# Patient Record
Sex: Male | Born: 1980 | Race: White | Hispanic: No | Smoking: Former smoker
Health system: Southern US, Community
[De-identification: ages and names within clinical notes are randomized; demographics above are authoritative.]

## PROBLEM LIST (undated history)

## (undated) DIAGNOSIS — F32A Depression, unspecified: Secondary | ICD-10-CM

## (undated) DIAGNOSIS — B019 Varicella without complication: Secondary | ICD-10-CM

## (undated) DIAGNOSIS — F329 Major depressive disorder, single episode, unspecified: Secondary | ICD-10-CM

## (undated) DIAGNOSIS — J45909 Unspecified asthma, uncomplicated: Secondary | ICD-10-CM

## (undated) HISTORY — DX: Varicella without complication: B01.9

## (undated) HISTORY — DX: Unspecified asthma, uncomplicated: J45.909

## (undated) HISTORY — DX: Depression, unspecified: F32.A

## (undated) HISTORY — DX: Major depressive disorder, single episode, unspecified: F32.9

---

## 2009-05-02 ENCOUNTER — Emergency Department (HOSPITAL_COMMUNITY): Admission: EM | Admit: 2009-05-02 | Discharge: 2009-05-02 | Payer: Self-pay | Admitting: Emergency Medicine

## 2010-04-01 HISTORY — PX: REPAIR / RECONSTRUCTION COLLATERAL LIGAMENT HAND: SUR1146

## 2010-04-01 HISTORY — PX: VASECTOMY: SHX75

## 2012-04-30 ENCOUNTER — Encounter: Payer: Self-pay | Admitting: Family Medicine

## 2012-04-30 ENCOUNTER — Ambulatory Visit (INDEPENDENT_AMBULATORY_CARE_PROVIDER_SITE_OTHER): Payer: No Typology Code available for payment source | Admitting: Family Medicine

## 2012-04-30 VITALS — BP 120/82 | HR 93 | Temp 98.3°F | Ht 67.25 in | Wt 145.0 lb

## 2012-04-30 DIAGNOSIS — Z7689 Persons encountering health services in other specified circumstances: Secondary | ICD-10-CM

## 2012-04-30 DIAGNOSIS — Z7189 Other specified counseling: Secondary | ICD-10-CM

## 2012-04-30 DIAGNOSIS — D239 Other benign neoplasm of skin, unspecified: Secondary | ICD-10-CM

## 2012-04-30 DIAGNOSIS — D229 Melanocytic nevi, unspecified: Secondary | ICD-10-CM

## 2012-04-30 LAB — LIPID PANEL
HDL: 83.7 mg/dL (ref >39.00)
Total CHOL/HDL Ratio: 2
Triglycerides: 43 mg/dL (ref 0.0–149.0)
VLDL: 8.6 mg/dL (ref 0.0–40.0)

## 2012-04-30 NOTE — Progress Notes (Signed)
Chief Complaint  Patient presents with  . Establish Care    HPI:  HURBERT DURAN is here to establish care. Has not had doctor in many years as did not have health insurance until recently.   Has the following chronic problems and concerns today:  There is no problem list on file for this patient.  Mole on Abdomen: -has been there forever, but feels like has enlarged some -no melanoma in family - but mother had some sort of skin lesion treated -has not had a lot of sun exposure  Health Maintenance: -had flu vaccine a few weeks ago -had tdap in 2012 when cut thumb  ROS: See pertinent positives and negatives per HPI.  Past Medical History  Diagnosis Date  . Asthma     mild, intermittent    Family History  Problem Relation Age of Onset  . Cancer Mother     breast ca  . Cancer Maternal Grandfather 47    prostate    History   Social History  . Marital Status: Single    Spouse Name: N/A    Number of Children: N/A  . Years of Education: N/A   Social History Main Topics  . Smoking status: Former Smoker    Quit date: 04/01/2006  . Smokeless tobacco: None  . Alcohol Use: Yes     Comment: occ - 1 glass of wine  . Drug Use: No  . Sexually Active: Yes     Comment: with wife   Other Topics Concern  . None   Social History Narrative   Work or School: works full time as a Investment banker, operational - [employer]Home Situation: lives with wife and 3 kidsSpiritual Beliefs: none, atheistLifestyle: does jumping jacks and push ups and stretching daily, eats a healthy diet    No current outpatient prescriptions on file.  EXAM:  Filed Vitals:   04/30/12 0930  Pulse: 93  Temp: 98.3 F (36.8 C)    Body mass index is 22.54 kg/(m^2).  GENERAL: vitals reviewed and listed above, alert, oriented, appears well hydrated and in no acute distress  HEENT: atraumatic, conjunttiva clear, no obvious abnormalities on inspection of external nose and ears  NECK: no obvious masses on  inspection  LUNGS: clear to auscultation bilaterally, no wheezes, rales or rhonchi, good air movement  CV: HRRR, no peripheral edema  SKIN: numerous moles, raised brown lesion with a few projecting hairs on L upper abdomen  MS: moves all extremities without noticeable abnormality  PSYCH: pleasant and cooperative, no obvious depression or anxiety  ASSESSMENT AND PLAN:  Discussed the following assessment and plan:  1. Establishing care with new doctor, encounter for  Lipid Panel, Hemoglobin A1c  2. Nevus     -FASTING LABS TODAY -We reviewed the PMH, PSH, FH, SH, Meds and Allergies. -pt will return for skin check and removal of mole on abdomen -all health maintenance recs for age reviewed -safe alcohol limits and AHA exercise recs discussed   Patient Instructions  -We have ordered labs or studies at this visit. It can take up to 1-2 weeks for results and processing. We will contact you with instructions IF your results are abnormal. Normal results will be released to your [facility]. If you have not heard from [place] or can not find your results in [facility] in 2 weeks please contact our office.  -PLEASE SIGN UP FOR MYCHART TODAY   We recommend the following healthy lifestyle measures: - eat a healthy diet consisting of lots of vegetables,  fruits, beans, nuts, seeds, healthy meats such as white chicken and fish and whole grains.  - avoid fried foods, fast food, processed foods, sodas, red meet and other fattening foods.  - get a least 150 minutes of aerobic exercise per week.   Follow up in: next month for a 30 minute procedure appointment to remove mole      KIM, HANNAH R.

## 2012-04-30 NOTE — Patient Instructions (Addendum)
-  We have ordered labs or studies at this visit. It can take up to 1-2 weeks for results and processing. We will contact you with instructions IF your results are abnormal. Normal results will be released to your [facility]. If you have not heard from [place] or can not find your results in [facility] in 2 weeks please contact our office.  -PLEASE SIGN UP FOR MYCHART TODAY   We recommend the following healthy lifestyle measures: - eat a healthy diet consisting of lots of vegetables, fruits, beans, nuts, seeds, healthy meats such as white chicken and fish and whole grains.  - avoid fried foods, fast food, processed foods, sodas, red meet and other fattening foods.  - get a least 150 minutes of aerobic exercise per week.   Follow up in: next month for a 30 minute procedure appointment to remove mole

## 2012-10-06 ENCOUNTER — Ambulatory Visit (INDEPENDENT_AMBULATORY_CARE_PROVIDER_SITE_OTHER): Payer: No Typology Code available for payment source | Admitting: Family Medicine

## 2012-10-06 ENCOUNTER — Encounter: Payer: Self-pay | Admitting: Family Medicine

## 2012-10-06 VITALS — BP 92/60 | HR 116 | Temp 99.0°F | Wt 145.0 lb

## 2012-10-06 DIAGNOSIS — J209 Acute bronchitis, unspecified: Secondary | ICD-10-CM

## 2012-10-06 MED ORDER — PREDNISONE 20 MG PO TABS: 40.0000 mg | ORAL_TABLET | Freq: Every day | ORAL | Status: DC

## 2012-10-06 MED ORDER — ALBUTEROL SULFATE (5 MG/ML) 0.5% IN NEBU
2.5000 mg | INHALATION_SOLUTION | Freq: Once | RESPIRATORY_TRACT | Status: AC
  Administered 2012-10-06: 2.5 mg via RESPIRATORY_TRACT

## 2012-10-06 MED ORDER — AZITHROMYCIN 250 MG PO TABS: ORAL_TABLET | ORAL | Status: DC

## 2012-10-06 NOTE — Progress Notes (Signed)
Chief Complaint  Patient presents with  . Cough    congestion, bodyaches, chills     HPI:  Acute visit for cough: -started about 3 days ago -symptoms: nasal congestion, drainage, wheezing, chills, body aches, productive cough, mild SOB -has inhaler for his asthma, but has not used -has tried: nothing, dayquil, NVD, fever":  -sick contacts: daughter sick  ROS: See pertinent positives and negatives per HPI.  Past Medical History  Diagnosis Date  . Asthma     mild, intermittent  . Chicken pox   . Depression     Family History  Problem Relation Age of Onset  . Cancer Mother     breast ca  . Cancer Maternal Grandfather 49    prostate    History   Social History  . Marital Status: Single    Spouse Name: N/A    Number of Children: N/A  . Years of Education: N/A   Social History Main Topics  . Smoking status: Former Smoker    Quit date: 04/01/2006  . Smokeless tobacco: None  . Alcohol Use: Yes     Comment: occ - 1 glass of wine  . Drug Use: No  . Sexually Active: Yes     Comment: with wife   Other Topics Concern  . 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  GENERAL: vitals reviewed and listed above, alert, oriented, appears well hydrated and in no acute distress  HEENT: atraumatic, conjunttiva clear, no obvious abnormalities on  inspection of external nose and ears  NECK: no obvious masses on inspection  LUNGS: difuse exp wheezing with prolonged exp  CV: HRRR, no peripheral edema  MS: moves all extremities without noticeable abnormality  PSYCH: pleasant and cooperative, no obvious depression or anxiety  ASSESSMENT AND PLAN:  Discussed the following assessment and plan:  Acute bronchitis - Plan: predniSONE (DELTASONE) 20 MG tablet, azithromycin (ZITHROMAX) 250 MG tablet, albuterol (PROVENTIL) (5 MG/ML) 0.5% nebulizer solution 2.5 mg  -hx mild intermittent asthma with acute bronchitis, improved exam with alb nebs in office, no resp distress and O2 sats normal.  -Recommendations per orders an instructions, risks and use of medications and return precautions discussed. -Patient advised to return or notify a doctor immediately if symptoms worsen or persist or new concerns arise.  There are no Patient Instructions on file for this visit.   Kriste Basque R.

## 2012-10-09 ENCOUNTER — Emergency Department (INDEPENDENT_AMBULATORY_CARE_PROVIDER_SITE_OTHER)
Admission: EM | Admit: 2012-10-09 | Discharge: 2012-10-09 | Disposition: A | Discharge status: Home / Self Care | Payer: No Typology Code available for payment source | Source: Home / Self Care | Attending: Family Medicine | Admitting: Family Medicine

## 2012-10-09 ENCOUNTER — Emergency Department (INDEPENDENT_AMBULATORY_CARE_PROVIDER_SITE_OTHER): Payer: No Typology Code available for payment source

## 2012-10-09 ENCOUNTER — Telehealth: Payer: Self-pay | Admitting: Family Medicine

## 2012-10-09 ENCOUNTER — Encounter (HOSPITAL_COMMUNITY): Payer: Self-pay | Admitting: Emergency Medicine

## 2012-10-09 DIAGNOSIS — J45901 Unspecified asthma with (acute) exacerbation: Secondary | ICD-10-CM

## 2012-10-09 DIAGNOSIS — J4531 Mild persistent asthma with (acute) exacerbation: Secondary | ICD-10-CM

## 2012-10-09 MED ORDER — ALBUTEROL SULFATE HFA 108 (90 BASE) MCG/ACT IN AERS: 1.0000 | INHALATION_SPRAY | Freq: Four times a day (QID) | RESPIRATORY_TRACT | Status: DC | PRN

## 2012-10-09 MED ORDER — LEVOFLOXACIN 500 MG PO TABS: 500.0000 mg | ORAL_TABLET | Freq: Every day | ORAL | Status: DC

## 2012-10-09 MED ORDER — IPRATROPIUM BROMIDE 0.02 % IN SOLN
0.5000 mg | Freq: Once | RESPIRATORY_TRACT | Status: AC
  Administered 2012-10-09: 0.5 mg via RESPIRATORY_TRACT

## 2012-10-09 MED ORDER — METHYLPREDNISOLONE SODIUM SUCC 125 MG IJ SOLR
INTRAMUSCULAR | Status: AC
  Filled 2012-10-09: qty 2

## 2012-10-09 MED ORDER — ALBUTEROL SULFATE (5 MG/ML) 0.5% IN NEBU
5.0000 mg | INHALATION_SOLUTION | Freq: Once | RESPIRATORY_TRACT | Status: AC
  Administered 2012-10-09: 5 mg via RESPIRATORY_TRACT

## 2012-10-09 MED ORDER — ALBUTEROL SULFATE (5 MG/ML) 0.5% IN NEBU
INHALATION_SOLUTION | RESPIRATORY_TRACT | Status: AC
  Filled 2012-10-09: qty 1

## 2012-10-09 MED ORDER — METHYLPREDNISOLONE SODIUM SUCC 125 MG IJ SOLR
125.0000 mg | Freq: Once | INTRAMUSCULAR | Status: AC
  Administered 2012-10-09: 125 mg via INTRAMUSCULAR

## 2012-10-09 NOTE — ED Provider Notes (Signed)
History    CSN: [id number] Arrival date & time 10/09/12  1438  None    Chief Complaint  Patient presents with  . Bronchitis   (Consider location/radiation/quality/duration/timing/severity/associated sxs/prior Treatment) Patient is a 32 y.o. male presenting with shortness of breath. The history is provided by the patient.  Shortness of Breath Severity:  Moderate Onset quality:  Gradual Duration:  5 days Progression:  Worsening Chronicity:  New Context: not animal exposure, not occupational exposure and not pollens   Context comment:  Seen by lmd on mon , given z-pack, no change in sx. Associated symptoms: cough, ear pain, PND, sputum production and wheezing   Associated symptoms: no chest pain, no fever and no sore throat   Risk factors: no tobacco use    Past Medical History  Diagnosis Date  . Asthma     mild, intermittent  . Chicken pox   . Depression    Past Surgical History  Procedure Laterality Date  . Vasectomy  2012  . Repair / reconstruction collateral ligament hand  2012   Family History  Problem Relation Age of Onset  . Cancer Mother     breast ca  . Cancer Maternal Grandfather 78    prostate   History  Substance Use Topics  . Smoking status: Former Smoker    Quit date: 04/01/2006  . Smokeless tobacco: Not on file  . Alcohol Use: Yes     Comment: occ - 1 glass of wine    Review of Systems  Constitutional: Negative.  Negative for fever.  HENT: Positive for ear pain, congestion and rhinorrhea. Negative for sore throat.   Respiratory: Positive for cough, sputum production, shortness of breath and wheezing.   Cardiovascular: Positive for PND. Negative for chest pain, palpitations and leg swelling.  Gastrointestinal: Negative.     Allergies  Review of patient's allergies indicates no known allergies.  Home Medications   Current Outpatient Rx  Name  Route  Sig  Dispense  Refill  . albuterol (PROVENTIL HFA;VENTOLIN HFA) 108 (90 BASE) MCG/ACT  inhaler   Inhalation   Inhale 1-2 puffs into the lungs every 6 (six) hours as needed for wheezing.   1 Inhaler   1   . azithromycin (ZITHROMAX) 250 MG tablet      2 tablets on first day, then 1 tablet daily for 4 days   6 tablet   0   . levofloxacin (LEVAQUIN) 500 MG tablet   Oral   Take 1 tablet (500 mg total) by mouth daily.   7 tablet   0   . predniSONE (DELTASONE) 20 MG tablet   Oral   Take 2 tablets (40 mg total) by mouth daily.   10 tablet   0    BP 128/78  Pulse 102  Temp(Src) 98.7 F (37.1 C) (Oral)  Resp 20  SpO2 95% Physical Exam  Nursing note and vitals reviewed. Constitutional: He is oriented to person, place, and time. He appears well-developed and well-nourished.  HENT:  Right Ear: Tympanic membrane, external ear and ear canal normal.  Left Ear: Ear canal normal. Tympanic membrane is scarred and erythematous. Tympanic membrane mobility is abnormal.  Mouth/Throat: Oropharynx is clear and moist.  Neck: Normal range of motion. Neck supple.  Cardiovascular: Normal rate, regular rhythm, normal heart sounds and intact distal pulses.   Pulmonary/Chest: He has wheezes. He has rhonchi. He has rales.  Neurological: He is alert and oriented to person, place, and time.  Skin: Skin is warm and  dry.    ED Course  Procedures (including critical care time) Labs Reviewed - No data to display Dg Chest 2 View  10/09/2012   *RADIOLOGY REPORT*  Clinical Data: Short of breath, cough  CHEST - 2 VIEW  Comparison: None.  Findings: No active infiltrate or effusion is seen.  There is some peribronchial thickening which may indicate bronchitis. Mediastinal contours appear normal.  The heart is within normal limits in size.  No bony abnormality is seen  IMPRESSION: It no pneumonia.  Peribronchial thickening may indicate bronchitis.   Original Report Authenticated By: Dwyane Dee, M.D.   1. Asthmatic bronchitis, mild persistent, with acute exacerbation     MDM  X-rays reviewed  and report per radiologist.  Mod wheezing continues but pt feels sx improved.   Linna Hoff, MD 10/09/12 [phone]

## 2012-10-09 NOTE — Telephone Encounter (Signed)
Called and left a detailed message for pt to go to urgent care of be seen at Sat clinic.  Apologized that clinic was full today.

## 2012-10-09 NOTE — Telephone Encounter (Signed)
Patient Information:  Caller Name: Noland Fordyce  Phone: [phone]  Patient: Tayte, Mcwherter  Gender: Male  DOB: 07-Jan-1981  Age: 32 Years  PCP: Kriste Basque [facility])  Office Follow Up:  Does the office need to follow up with this patient?: Yes  Instructions For The Office: Please see if pt can be worked in. If not, please call wife and encourage them to go to UC.  RN Note:  No appt's available. Please call wife back and let her know if pt can be worked in or do they need to go to Urgent Care. Please advise.  Symptoms  Reason For Call & Symptoms: Wife calling regarding pt c/o of ear ache this am upon awakening. Has not treated that. Taking Zpack for Bronchitis dx on 10/06/12. Not better. Wife prefers pt be seen. Triage offered and declined. Denies respiratory distress.  Reviewed Health History In EMR: N/A  Reviewed Medications In EMR: N/A  Reviewed Allergies In EMR: N/A  Reviewed Surgeries / Procedures: N/A  Date of Onset of Symptoms: 10/06/2012  Guideline(s) Used:  No Protocol Available - Sick Adult  Disposition Per Guideline:   Callback by PCP or Subspecialist within 1 Hour  Reason For Disposition Reached:   Nursing judgment  Advice Given:  N/A  Patient Will Follow Care Advice:  YES

## 2012-10-09 NOTE — ED Notes (Signed)
Pt recently dx w/Bronchities by PCP on Tuesday... Given Azithromycin and prednisone w/no relief... No xrays were done at PCP's office... Tolerating well meds... sxs today include: persistent cough w/green mucous, woke up w/this am w/left ear pain... Reports some of his other sxs are getting better but still not feeling well... Hx of asthma... Denies: f/v/n/d... He is alert w/no signs of acute respiratory distress.

## 2012-10-09 NOTE — Telephone Encounter (Signed)
Got this message at end of clinic. Advise sat am clinic.

## 2012-10-10 ENCOUNTER — Encounter (HOSPITAL_COMMUNITY): Payer: Self-pay | Admitting: Emergency Medicine

## 2012-10-10 ENCOUNTER — Emergency Department (HOSPITAL_COMMUNITY): Payer: No Typology Code available for payment source

## 2012-10-10 ENCOUNTER — Emergency Department (HOSPITAL_COMMUNITY)
Admission: EM | Admit: 2012-10-10 | Discharge: 2012-10-10 | Disposition: A | Discharge status: Home / Self Care | Payer: No Typology Code available for payment source | Attending: Emergency Medicine | Admitting: Emergency Medicine

## 2012-10-10 DIAGNOSIS — J45909 Unspecified asthma, uncomplicated: Secondary | ICD-10-CM | POA: Insufficient documentation

## 2012-10-10 DIAGNOSIS — K2970 Gastritis, unspecified, without bleeding: Secondary | ICD-10-CM | POA: Insufficient documentation

## 2012-10-10 DIAGNOSIS — Z8619 Personal history of other infectious and parasitic diseases: Secondary | ICD-10-CM | POA: Insufficient documentation

## 2012-10-10 DIAGNOSIS — Z87891 Personal history of nicotine dependence: Secondary | ICD-10-CM | POA: Insufficient documentation

## 2012-10-10 DIAGNOSIS — Z79899 Other long term (current) drug therapy: Secondary | ICD-10-CM | POA: Insufficient documentation

## 2012-10-10 DIAGNOSIS — J690 Pneumonitis due to inhalation of food and vomit: Secondary | ICD-10-CM | POA: Insufficient documentation

## 2012-10-10 DIAGNOSIS — Z8659 Personal history of other mental and behavioral disorders: Secondary | ICD-10-CM | POA: Insufficient documentation

## 2012-10-10 LAB — URINE MICROSCOPIC-ADD ON

## 2012-10-10 LAB — COMPREHENSIVE METABOLIC PANEL
ALT: 16 U/L (ref 0–53)
AST: 24 U/L (ref 0–37)
Albumin: 3.9 g/dL (ref 3.5–5.2)
BUN: 11 mg/dL (ref 6–23)
CO2: 28 mEq/L (ref 19–32)
Calcium: 9.9 mg/dL (ref 8.4–10.5)
Chloride: 95 mEq/L — ABNORMAL LOW (ref 96–112)
Creatinine, Ser: 0.6 mg/dL (ref 0.50–1.35)
GFR calc Af Amer: >90 mL/min (ref >90)
Sodium: 135 mEq/L (ref 135–145)
Total Protein: 8.6 g/dL — ABNORMAL HIGH (ref 6.0–8.3)

## 2012-10-10 LAB — CBC WITH DIFFERENTIAL/PLATELET
Eosinophils Absolute: 0 10*3/uL (ref 0.0–0.7)
Lymphocytes Relative: 6 % — ABNORMAL LOW (ref 12–46)
MCH: 32.9 pg (ref 26.0–34.0)
MCV: 94.7 fL (ref 78.0–100.0)
Monocytes Relative: 8 % (ref 3–12)
Neutro Abs: 16.5 10*3/uL — ABNORMAL HIGH (ref 1.7–7.7)
Neutrophils Relative %: 86 % — ABNORMAL HIGH (ref 43–77)
Platelets: 238 10*3/uL (ref 150–400)
RBC: 4.31 MIL/uL (ref 4.22–5.81)
RDW: 12.4 % (ref 11.5–15.5)

## 2012-10-10 LAB — URINALYSIS, ROUTINE W REFLEX MICROSCOPIC
Bilirubin Urine: NEGATIVE
Glucose, UA: NEGATIVE mg/dL
Hgb urine dipstick: NEGATIVE
Ketones, ur: 15 mg/dL — AB
Nitrite: NEGATIVE
Specific Gravity, Urine: 1.026 (ref 1.005–1.030)

## 2012-10-10 MED ORDER — LORAZEPAM 2 MG/ML IJ SOLN
0.5000 mg | Freq: Once | INTRAMUSCULAR | Status: AC
  Administered 2012-10-10: 0.5 mg via INTRAVENOUS
  Filled 2012-10-10: qty 1

## 2012-10-10 MED ORDER — ONDANSETRON HCL 4 MG/2ML IJ SOLN
4.0000 mg | Freq: Once | INTRAMUSCULAR | Status: AC
  Administered 2012-10-10: 4 mg via INTRAVENOUS
  Filled 2012-10-10: qty 2

## 2012-10-10 MED ORDER — HYDROMORPHONE HCL PF 1 MG/ML IJ SOLN
1.0000 mg | Freq: Once | INTRAMUSCULAR | Status: AC
  Administered 2012-10-10: 1 mg via INTRAVENOUS
  Filled 2012-10-10: qty 1

## 2012-10-10 MED ORDER — PANTOPRAZOLE SODIUM 20 MG PO TBEC: 40.0000 mg | DELAYED_RELEASE_TABLET | Freq: Every day | ORAL | Status: DC

## 2012-10-10 MED ORDER — IOHEXOL 300 MG/ML  SOLN
100.0000 mL | Freq: Once | INTRAMUSCULAR | Status: AC | PRN
  Administered 2012-10-10: 100 mL via INTRAVENOUS

## 2012-10-10 MED ORDER — IOHEXOL 300 MG/ML  SOLN
50.0000 mL | Freq: Once | INTRAMUSCULAR | Status: AC | PRN
  Administered 2012-10-10: 50 mL via ORAL

## 2012-10-10 MED ORDER — HYDROCODONE-ACETAMINOPHEN 5-325 MG PO TABS: 1.0000 | ORAL_TABLET | Freq: Four times a day (QID) | ORAL | Status: DC | PRN

## 2012-10-10 MED ORDER — PANTOPRAZOLE SODIUM 40 MG IV SOLR
40.0000 mg | Freq: Once | INTRAVENOUS | Status: AC
  Administered 2012-10-10: 40 mg via INTRAVENOUS
  Filled 2012-10-10: qty 40

## 2012-10-10 NOTE — ED Notes (Signed)
Wife at bedside. Pt c/o mid abdominal pain onset today. States he left work d/t increase pain intensity. He is currently experiencing intermittent spasms and guarding abdomen.

## 2012-10-10 NOTE — ED Notes (Signed)
Pt from home c/o severe onset abd pain 4 hrs prior to arrival. Pt denies N/V/D, fever, SOB, CP. Pt states that he is currently being tx for acute asthmatic bronchitis with azithromycin and prednisone. Pt last BM was yesterday. Pt is A&O and in NAD

## 2012-10-10 NOTE — ED Provider Notes (Signed)
History    CSN: [id number] Arrival date & time 10/10/12  1718  First MD Initiated Contact with Patient 10/10/12 1724     Chief Complaint  Patient presents with  . Abdominal Pain   (Consider location/radiation/quality/duration/timing/severity/associated sxs/prior Treatment) Patient is a 32 y.o. male presenting with abdominal pain. The history is provided by the patient (pt complains of abd pain.  he just finished zpack and prednisone for bronchitis). No language interpreter was used.  Abdominal Pain This is a new problem. The current episode started 12 to 24 hours ago. The problem occurs constantly. The problem has not changed since onset.Associated symptoms include abdominal pain. Pertinent negatives include no chest pain and no headaches. Nothing aggravates the symptoms. Nothing relieves the symptoms.   Past Medical History  Diagnosis Date  . Asthma     mild, intermittent  . Chicken pox   . Depression    Past Surgical History  Procedure Laterality Date  . Vasectomy  2012  . Repair / reconstruction collateral ligament hand  2012   Family History  Problem Relation Age of Onset  . Cancer Mother     breast ca  . Cancer Maternal Grandfather 22    prostate   History  Substance Use Topics  . Smoking status: Former Smoker    Quit date: 04/01/2006  . Smokeless tobacco: Not on file  . Alcohol Use: Yes     Comment: occ - 1 glass of wine    Review of Systems  Constitutional: Negative for appetite change and fatigue.  HENT: Negative for congestion, sinus pressure and ear discharge.   Eyes: Negative for discharge.  Respiratory: Negative for cough.   Cardiovascular: Negative for chest pain.  Gastrointestinal: Positive for abdominal pain. Negative for diarrhea.  Genitourinary: Negative for frequency and hematuria.  Musculoskeletal: Negative for back pain.  Skin: Negative for rash.  Neurological: Negative for seizures and headaches.  Psychiatric/Behavioral: Negative for  hallucinations.    Allergies  Review of patient's allergies indicates no known allergies.  Home Medications   Current Outpatient Rx  Name  Route  Sig  Dispense  Refill  . albuterol (PROVENTIL HFA;VENTOLIN HFA) 108 (90 BASE) MCG/ACT inhaler   Inhalation   Inhale 1-2 puffs into the lungs every 6 (six) hours as needed for wheezing.   1 Inhaler   1   . amphetamine-dextroamphetamine (ADDERALL) 10 MG tablet   Oral   Take 10 mg by mouth once.         Marland Kitchen azithromycin (ZITHROMAX) 250 MG tablet      2 tablets on first day, then 1 tablet daily for 4 days   6 tablet   0   . predniSONE (DELTASONE) 20 MG tablet   Oral   Take 2 tablets (40 mg total) by mouth daily.   10 tablet   0   . HYDROcodone-acetaminophen (NORCO/VICODIN) 5-325 MG per tablet   Oral   Take 1 tablet by mouth every 6 (six) hours as needed for pain.   20 tablet   0   . levofloxacin (LEVAQUIN) 500 MG tablet   Oral   Take 1 tablet (500 mg total) by mouth daily.   7 tablet   0   . pantoprazole (PROTONIX) 20 MG tablet   Oral   Take 2 tablets (40 mg total) by mouth daily.   20 tablet   0    BP 133/95  Pulse 67  Temp(Src) 98 F (36.7 C) (Oral)  Resp 18  SpO2 97% Physical  Exam  Constitutional: He is oriented to person, place, and time. He appears well-developed.  HENT:  Head: Normocephalic.  Eyes: Conjunctivae and EOM are normal. No scleral icterus.  Neck: Neck supple. No thyromegaly present.  Cardiovascular: Normal rate and regular rhythm.  Exam reveals no gallop and no friction rub.   No murmur heard. Pulmonary/Chest: No stridor. He has no wheezes. He has no rales. He exhibits no tenderness.  Abdominal: He exhibits no distension. There is tenderness. There is no rebound.  Tender epigastric  Musculoskeletal: Normal range of motion. He exhibits no edema.  Lymphadenopathy:    He has no cervical adenopathy.  Neurological: He is oriented to person, place, and time. Coordination normal.  Skin: No rash  noted. No erythema.  Psychiatric: He has a normal mood and affect. His behavior is normal.    ED Course  Procedures (including critical care time) Labs Reviewed  CBC WITH DIFFERENTIAL - Abnormal; Notable for the following:    WBC 19.2 (*)    Neutrophils Relative % 86 (*)    Lymphocytes Relative 6 (*)    Neutro Abs 16.5 (*)    Monocytes Absolute 1.5 (*)    All other components within normal limits  COMPREHENSIVE METABOLIC PANEL - Abnormal; Notable for the following:    Chloride 95 (*)    Glucose, Bld 118 (*)    Total Protein 8.6 (*)    All other components within normal limits  URINALYSIS, ROUTINE W REFLEX MICROSCOPIC - Abnormal; Notable for the following:    Ketones, ur 15 (*)    Protein, ur 30 (*)    All other components within normal limits  LIPASE, BLOOD  URINE MICROSCOPIC-ADD ON   Dg Chest 2 View  10/09/2012   *RADIOLOGY REPORT*  Clinical Data: Short of breath, cough  CHEST - 2 VIEW  Comparison: None.  Findings: No active infiltrate or effusion is seen.  There is some peribronchial thickening which may indicate bronchitis. Mediastinal contours appear normal.  The heart is within normal limits in size.  No bony abnormality is seen  IMPRESSION: It no pneumonia.  Peribronchial thickening may indicate bronchitis.   Original Report Authenticated By: Dwyane Dee, M.D.   Ct Abdomen Pelvis W Contrast  10/10/2012   *RADIOLOGY REPORT*  Clinical Data: Abdominal pain.  Evaluate for possible appendicitis. No fever.  Elevated white count.  CT ABDOMEN AND PELVIS WITH CONTRAST  Technique:  Multidetector CT imaging of the abdomen and pelvis was performed following the standard protocol during bolus administration of intravenous contrast.  Contrast: OMNIPAQUE IOHEXOL 300 MG/ML  SOLN, 50mL OMNIPAQUE IOHEXOL 300 MG/ML  SOLN  Comparison: None.  Findings: No CT findings of appendicitis.  No extraluminal bowel inflammatory process, free fluid or free air. Small bowel loops appear partially dilated  without point of obstruction identified.  Etiology/significance indeterminate. Additionally, there is mild thickening of the gastric antrum possibly related to peristalsis.  Primary gastric abnormality would be difficult to exclude given this appearance.  No calcified gallstones or CT evidence of gallbladder inflammation. If this is of concern ultrasound may then be considered.  No worrisome hepatic, splenic, pancreatic, renal or adrenal lesion.  No abdominal aortic aneurysm.  No bony destructive lesion.  No hernia noted.  Patchy changes lung bases with a 1.4 cm opacification right middle lobe.  Findings may represent result of infection.  Recommend treating any clinically suspected infectious process with follow-up CT after treatment to exclude a primary lung lesion.  IMPRESSION: No CT findings of appendicitis.  No extraluminal bowel inflammatory process, free fluid or free air. Small bowel loops appear partially dilated without point of obstruction identified.  Etiology/significance indeterminate. Additionally, there is mild thickening of the gastric antrum possibly related to peristalsis.  Primary gastric abnormality would be difficult to exclude given this appearance.  Patchy changes lung bases with a 1.4 cm opacification right middle lobe.  Findings may represent result of infection.  Recommend treating any clinically suspected infectious process with follow-up CT after treatment to exclude a primary lung lesion.   Original Report Authenticated By: Lacy Duverney, M.D.   Dg Abd Acute W/chest  10/10/2012   *RADIOLOGY REPORT*  Clinical Data: Mid abdominal pain.  Bronchitis.  ACUTE ABDOMEN SERIES (ABDOMEN 2 VIEW & CHEST 1 VIEW)  Comparison:  Chest radiograph on 10/09/2012  Findings:  There is no evidence of dilated bowel loops or free intraperitoneal air.  No radiopaque calculi or other significant radiographic abnormality is seen. Heart size and mediastinal contours are within normal limits.  Both lungs are  clear.  IMPRESSION: Negative abdominal radiographs.  No acute cardiopulmonary disease.   Original Report Authenticated By: Myles Rosenthal, M.D.   1. Gastritis   2. Aspiration pneumonia     MDM    Benny Lennert, MD 10/10/12 2124

## 2012-10-10 NOTE — ED Notes (Signed)
Pt aware of the need for a urine sample. Urinal at bedside. 

## 2012-10-15 ENCOUNTER — Ambulatory Visit (INDEPENDENT_AMBULATORY_CARE_PROVIDER_SITE_OTHER): Payer: No Typology Code available for payment source | Admitting: Family Medicine

## 2012-10-15 ENCOUNTER — Encounter: Payer: Self-pay | Admitting: Family Medicine

## 2012-10-15 VITALS — BP 110/80 | HR 71 | Temp 98.5°F | Wt 142.0 lb

## 2012-10-15 DIAGNOSIS — J4520 Mild intermittent asthma, uncomplicated: Secondary | ICD-10-CM

## 2012-10-15 DIAGNOSIS — J45909 Unspecified asthma, uncomplicated: Secondary | ICD-10-CM

## 2012-10-15 MED ORDER — PREDNISONE 20 MG PO TABS: ORAL_TABLET | ORAL | Status: DC

## 2012-10-15 NOTE — Patient Instructions (Addendum)
Prednisone one tablet daily until your symptoms abate.............. Half a tablet for 7 days then a half a tab Monday Wednesday Friday for a full week taper  Return when necessary

## 2012-10-15 NOTE — Progress Notes (Signed)
  Subjective:    Patient ID: Frank Schroeder, male    DOB: 1980-05-13, 32 y.o.   MRN: [id number]  HPI  Frank Schroeder is a 32 year old married male nonsmoker,,,,,,,,, quit 3 years ago,,,,,,, who has a history of underlying allergic rhinitis who comes in today for evaluation of a cough  He was seen here about 10 days ago and was diagnosed to have bronchitis. He was given a Z-Pak and 5 days the prednisone. His symptoms got worse he got more short of breath. He therefore went to the emergency room. Chest x-ray and CT scan of the abdomen all were normal. He was given Levaquin and told he had bronchitis. He took the Levaquin and hasn't helped he still coughing and wheezing.  Historically has an episode like this once every year or 2. Sometimes triggered by a viral infection. Again he does have a history of allergic rhinitis.  Environmental review of systems otherwise negative  Review of Systems    general and pulmonary review of systems otherwise negative Objective:   Physical Exam Well-developed well-nourished male no acute distress examination the HEENT were negative neck was supple no adenopathy thyroid normal chest shows symmetrical normal breath sounds late expiratory wheezing on forced expiration.       Assessment & Plan:  Reactive airways disease with wheezing,,,,,,,,,,,,,prednisone burst and taper

## 2013-02-04 ENCOUNTER — Other Ambulatory Visit: Payer: Self-pay

## 2013-09-20 ENCOUNTER — Encounter: Payer: Self-pay | Admitting: *Deleted

## 2013-09-20 ENCOUNTER — Other Ambulatory Visit: Payer: Self-pay | Admitting: Family Medicine

## 2013-09-20 ENCOUNTER — Ambulatory Visit (INDEPENDENT_AMBULATORY_CARE_PROVIDER_SITE_OTHER): Payer: Self-pay | Admitting: Family Medicine

## 2013-09-20 ENCOUNTER — Encounter: Payer: Self-pay | Admitting: Family Medicine

## 2013-09-20 VITALS — BP 110/76 | HR 75 | Temp 98.8°F | Ht 67.25 in | Wt 143.0 lb

## 2013-09-20 DIAGNOSIS — D239 Other benign neoplasm of skin, unspecified: Secondary | ICD-10-CM

## 2013-09-20 DIAGNOSIS — D229 Melanocytic nevi, unspecified: Secondary | ICD-10-CM

## 2013-09-20 NOTE — Progress Notes (Signed)
No chief complaint on file.   HPI:  Acute visit for:  1)Skin lesion: -wants to remove mole on abdomen -no recent changes -sometimes irritated  -denies: swelling, drainage, bleeding -denies FH melanoma  ROS: See pertinent positives and negatives per HPI.  Past Medical History  Diagnosis Date  . Asthma     mild, intermittent  . Chicken pox   . Depression     Past Surgical History  Procedure Laterality Date  . Vasectomy  2012  . Repair / reconstruction collateral ligament hand  2012    Family History  Problem Relation Age of Onset  . Cancer Mother     breast ca  . Cancer Maternal Grandfather 50    prostate    History   Social History  . Marital Status: Single    Spouse Name: N/A    Number of Children: N/A  . Years of Education: N/A   Social History Main Topics  . Smoking status: Former Smoker    Quit date: 04/01/2006  . Smokeless tobacco: None  . Alcohol Use: Yes     Comment: occ - 1 glass of wine  . Drug Use: No  . Sexual Activity: Yes     Comment: with wife   Other Topics Concern  . 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  GENERAL: vitals reviewed and listed above, alert, oriented, appears well hydrated and in no acute distress  HEENT: atraumatic, conjunttiva clear, no obvious abnormalities on inspection of external nose and ears  NECK: no obvious masses on  inspection  SKIN: raised, 35mm x 56mm dark brown verrucous papule with irr borders on L mid abdomen  MS: moves all extremities without noticeable abnormality  PSYCH: pleasant and cooperative, no obvious depression or anxiety  ASSESSMENT AND PLAN:  Discussed the following assessment and plan:  Atypical nevus  Shave Biopsy: Informed consent obtained after explanation of risks/benefits and other options. Time out. Area cleaned with betadine. Local anesthesia with lidocaine 1% with epi. Shave removal of lesion. Hemostasis with drysol. Tolerated well. Specimen sent for path. Wound care instructions provided. Will contact pt with path results.  -Patient advised to return or notify a doctor immediately if symptoms worsen or persist or new concerns arise.  Patient Instructions  -please see dermatologist once yearly for skin exam and use good sunscreen when in the sun  -We have ordered labs or studies at this visit. It can take up to 1-2 weeks for results and processing. We will contact you with instructions IF your results are abnormal. Normal results will be released to your [facility]. If you have not heard from [place] or can not find your results in [facility] in 2 weeks please contact our office.  Colin Benton R.

## 2013-09-20 NOTE — Progress Notes (Signed)
Pre visit review using our clinic review tool, if applicable. No additional management support is needed unless otherwise documented below in the visit note. 

## 2013-09-20 NOTE — Addendum Note (Signed)
Addended by: Agnes Lawrence on: 09/20/2013 10:19 AM   Modules accepted: Orders

## 2013-09-20 NOTE — Patient Instructions (Signed)
-  please see dermatologist once yearly for skin exam and use good sunscreen when in the sun  -We have ordered labs or studies at this visit. It can take up to 1-2 weeks for results and processing. We will contact you with instructions IF your results are abnormal. Normal results will be released to your [facility]. If you have not heard from [place] or can not find your results in [facility] in 2 weeks please contact our office.

## 2014-05-26 ENCOUNTER — Ambulatory Visit (INDEPENDENT_AMBULATORY_CARE_PROVIDER_SITE_OTHER): Payer: BLUE CROSS/BLUE SHIELD | Admitting: Family Medicine

## 2014-05-26 ENCOUNTER — Encounter: Payer: Self-pay | Admitting: Family Medicine

## 2014-05-26 VITALS — BP 120/80 | HR 83 | Temp 98.5°F | Ht 67.25 in | Wt 151.9 lb

## 2014-05-26 DIAGNOSIS — J069 Acute upper respiratory infection, unspecified: Secondary | ICD-10-CM

## 2014-05-26 NOTE — Patient Instructions (Signed)
INSTRUCTIONS FOR UPPER RESPIRATORY INFECTION:  -plenty of rest and fluids  -nasal saline wash 2-3 times daily (use prepackaged nasal saline or bottled/distilled water if making your own)   -can use afrin nasal spray for drainage and nasal congestion - but do NOT use longer then 3-4 days  -can use tylenol or ibuprofen as directed for aches and sorethroat  -in the winter time, using a humidifier at night is helpful (please follow cleaning instructions)  -if you are taking a cough medication - use only as directed, may also try a teaspoon of honey to coat the throat and throat lozenges  -for sore throat, salt water gargles can help  -follow up if you have fevers, facial pain, tooth pain, difficulty breathing or are worsening or not getting better as expected

## 2014-05-26 NOTE — Progress Notes (Signed)
HPI:   URI: -started: today -symptoms:PND, sore throat, tickle in throat -denies:fever, SOB, NVD, tooth pain, sinus pain -has tried: no -sick contacts/travel/risks: denies flu exposure, tick exposure or or Ebola risks, daughter with cold, his kids were around kids who had strep   ROS: See pertinent positives and negatives per HPI.  Past Medical History  Diagnosis Date  . Asthma     mild, intermittent  . Chicken pox   . Depression     Past Surgical History  Procedure Laterality Date  . Vasectomy  2012  . Repair / reconstruction collateral ligament hand  2012    Family History  Problem Relation Age of Onset  . Cancer Mother     breast ca  . Cancer Maternal Grandfather 54    prostate    History   Social History  . Marital Status: Single    Spouse Name: N/A  . Number of Children: N/A  . Years of Education: N/A   Social History Main Topics  . Smoking status: Former Smoker    Quit date: 04/01/2006  . Smokeless tobacco: Not on file  . Alcohol Use: Yes     Comment: occ - 1 glass of wine  . Drug Use: No  . Sexual Activity: Yes     Comment: with wife   Other Topics Concern  . 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  albuterol (PROVENTIL HFA;VENTOLIN HFA) 108 (90 BASE) MCG/ACT inhaler, Inhale 1-2 puffs into the lungs every 6 (six) hours as needed for wheezing., Disp: 1 Inhaler, Rfl: 1  EXAM:  Filed Vitals:   05/26/14 1352  BP: 120/80  Pulse: 83  Temp: 98.5 F (36.9 C)    Body mass index is 23.62 kg/(m^2).  GENERAL: vitals reviewed and listed above, alert, oriented, appears well hydrated and in no acute distress  HEENT: atraumatic, conjunttiva clear, no obvious  abnormalities on inspection of external nose and ears, normal appearance of ear canals and TMs, clear nasal congestion, mild post oropharyngeal erythema with PND, no tonsillar edema or exudate, no sinus TTP  NECK: no obvious masses on inspection  LUNGS: clear to auscultation bilaterally, no wheezes, rales or rhonchi, good air movement  CV: HRRR, no peripheral edema  MS: moves all extremities without noticeable abnormality  PSYCH: pleasant and cooperative, no obvious depression or anxiety  ASSESSMENT AND PLAN:  Discussed the following assessment and plan:  Acute upper respiratory infection  -given HPI and exam findings today, a serious infection or illness is unlikely. We discussed potential etiologies, with VURI being most likely, and advised supportive care and monitoring. We discussed treatment side effects, likely course, antibiotic misuse, transmission, and signs of developing a serious illness. -of course, we advised to return or notify a doctor immediately if symptoms worsen or persist or new concerns arise.    Patient Instructions  INSTRUCTIONS FOR UPPER RESPIRATORY INFECTION:  -plenty of rest and fluids  -nasal saline wash 2-3 times daily (use prepackaged nasal saline or bottled/distilled water if making your own)   -can use afrin nasal spray for drainage and nasal  congestion - but do NOT use longer then 3-4 days  -can use tylenol or ibuprofen as directed for aches and sorethroat  -in the winter time, using a humidifier at night is helpful (please follow cleaning instructions)  -if you are taking a cough medication - use only as directed, may also try a teaspoon of honey to coat the throat and throat lozenges  -for sore throat, salt water gargles can help  -follow up if you have fevers, facial pain, tooth pain, difficulty breathing or are worsening or not getting better as expected      Frank Schroeder, [name] R.

## 2014-05-26 NOTE — Progress Notes (Signed)
Pre visit review using our clinic review tool, if applicable. No additional management support is needed unless otherwise documented below in the visit note. 

## 2014-06-29 IMAGING — CT CT ABD-PELV W/ CM
1 of 2 series · 15 of 32 positions shown, 19 images · IV contrast (OMNIPAQUE 300)
Comparison: None.

CLINICAL DATA: Abdominal pain.  Evaluate for possible appendicitis.
No fever.  Elevated white count.

CT ABDOMEN AND PELVIS WITH CONTRAST
TECHNIQUE: Multidetector CT imaging of the abdomen and pelvis was
performed following the standard protocol during bolus
administration of intravenous contrast.
Contrast: 100mL OMNIPAQUE IOHEXOL 300 MG/ML  SOLN, 50mL OMNIPAQUE
IOHEXOL 300 MG/ML  SOLN

[Series 2: abd/pel with · axial · 0.66mm/px · z∈[-499,-109]mm · 15 of 86 slices shown, 19 images]
[im 4/86  soft-tissue]
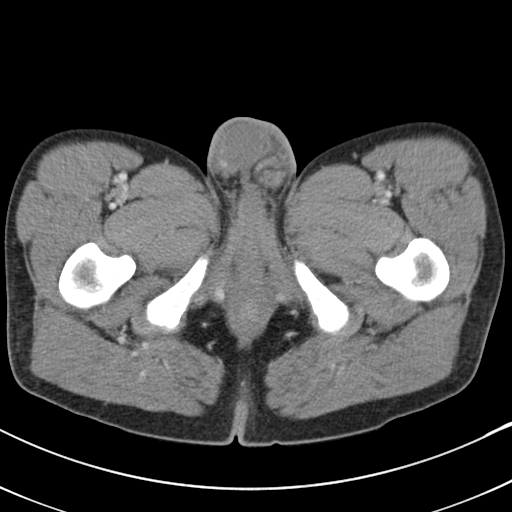
[im 4/86  bone]
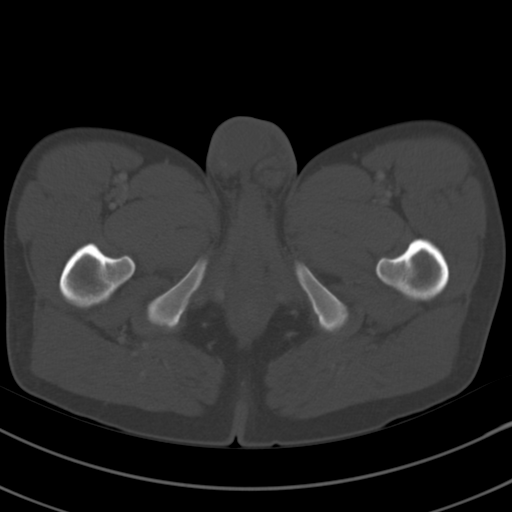
[im 10/86  soft-tissue]
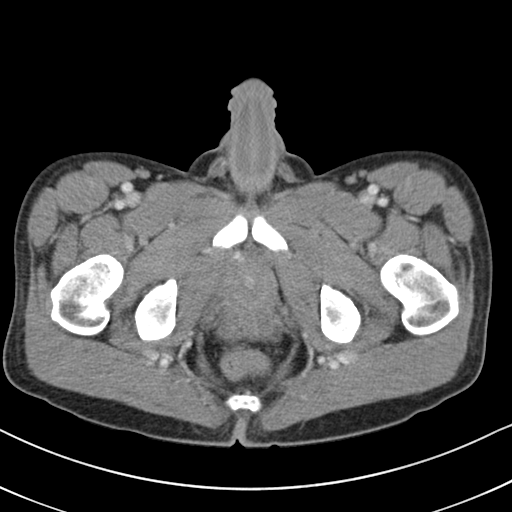
[im 17/86  soft-tissue]
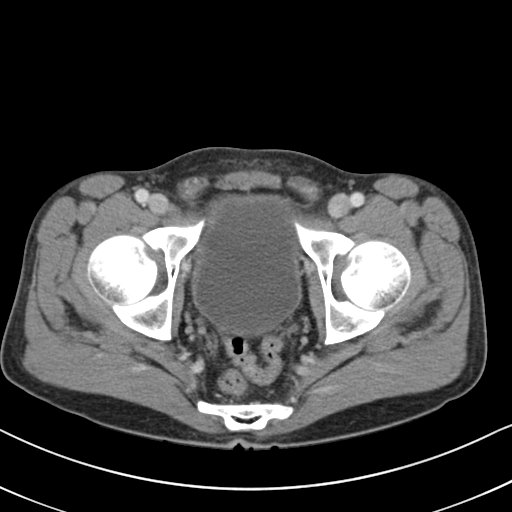
[im 23/86  soft-tissue]
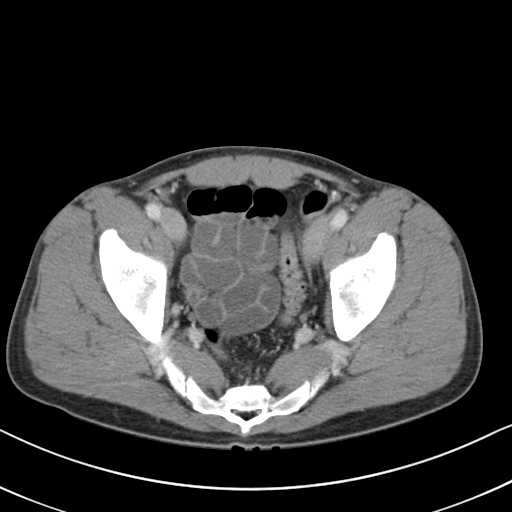
[im 30/86  soft-tissue]
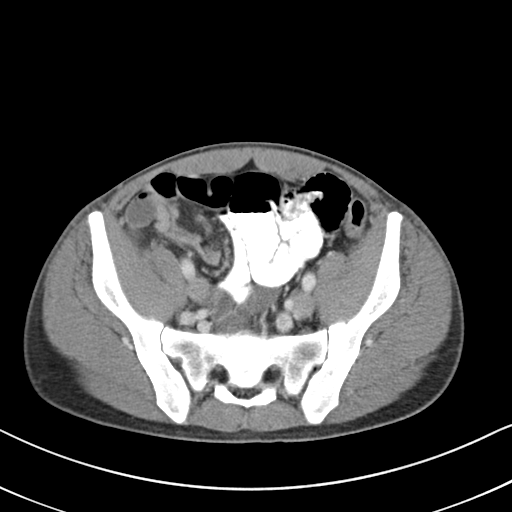
[im 36/86  soft-tissue]
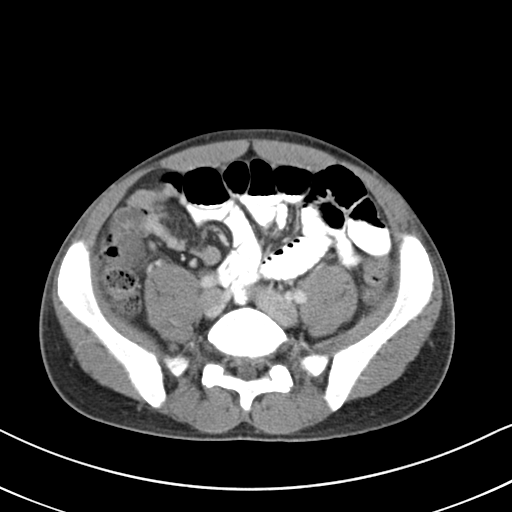
[im 43/86  soft-tissue]
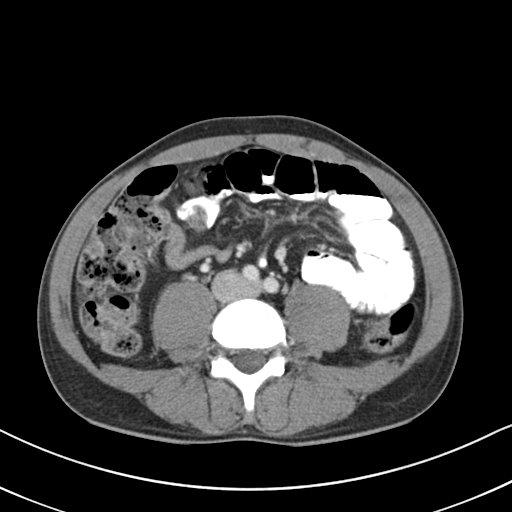
[im 50/86  soft-tissue]
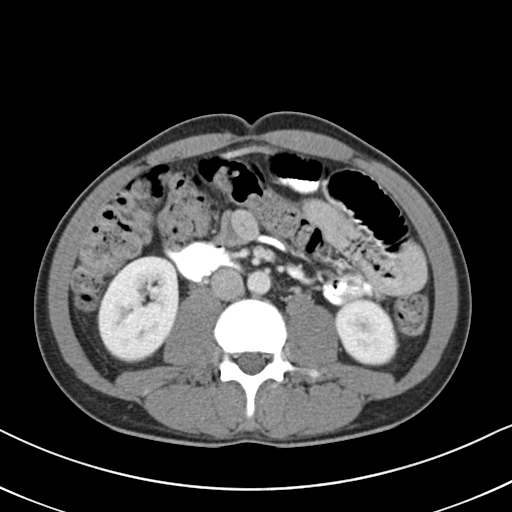
[im 56/86  soft-tissue]
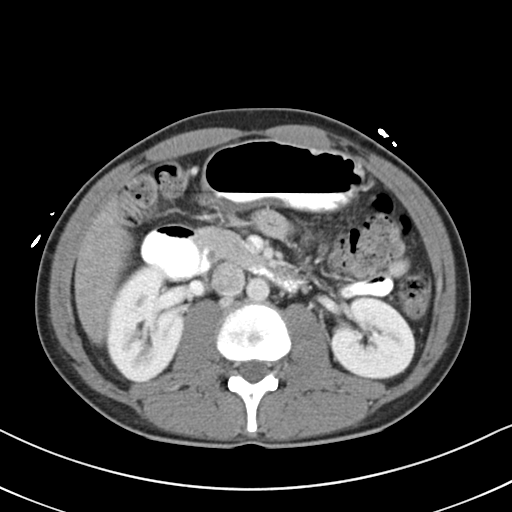
[im 56/86  bone]
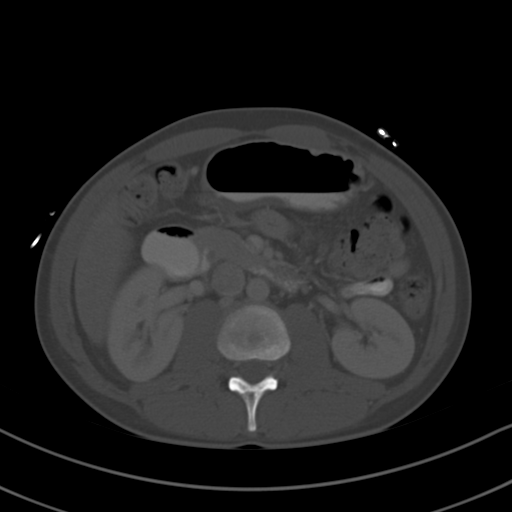
[im 63/86  soft-tissue]
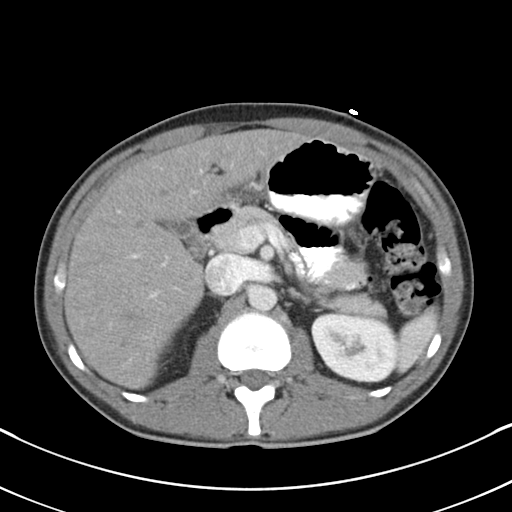
[im 69/86  soft-tissue]
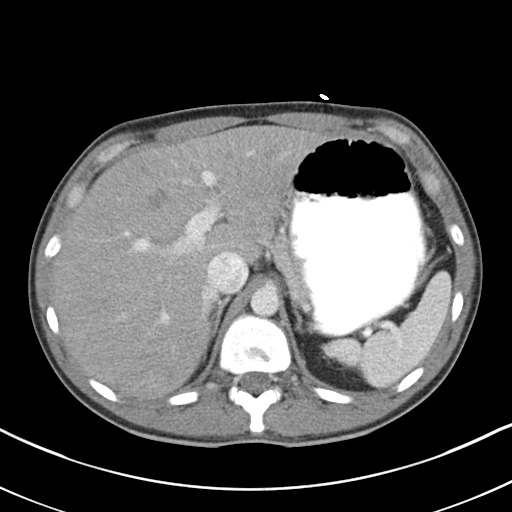
[im 72/86  lung]
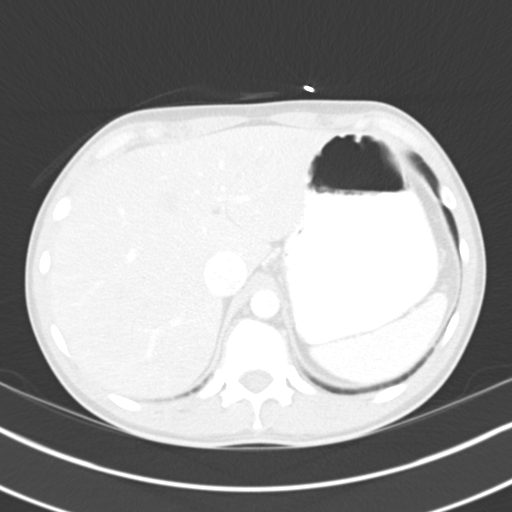
[im 76/86  soft-tissue]
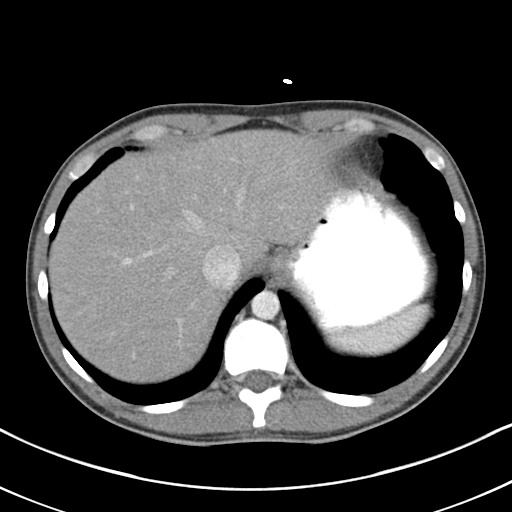
[im 76/86  lung]
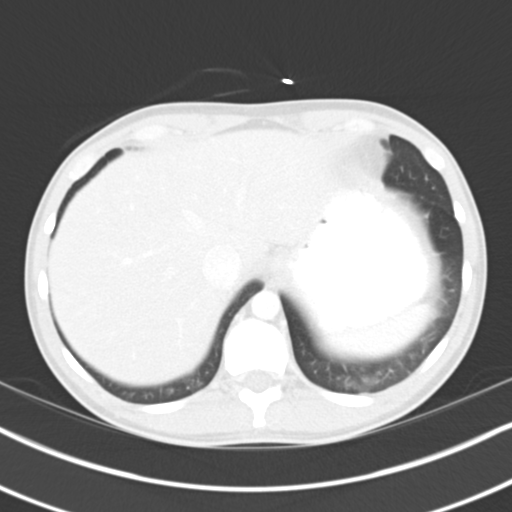
[im 79/86  lung]
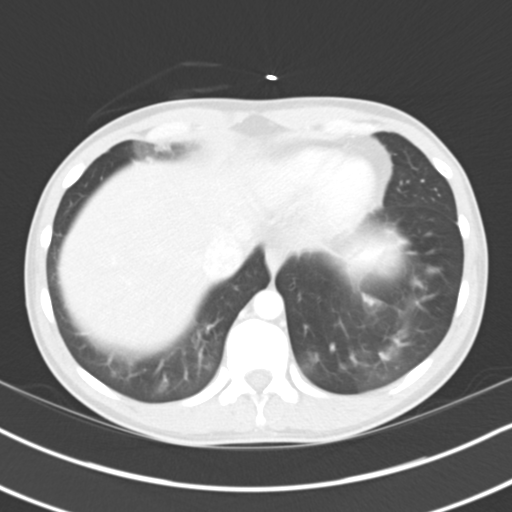
[im 82/86  soft-tissue]
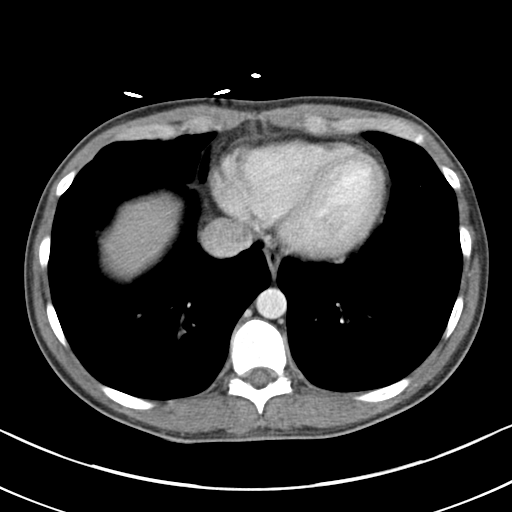
[im 82/86  lung]
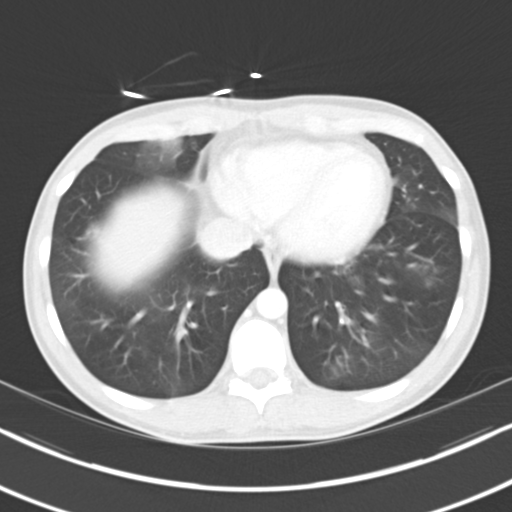

[15 of 32 positions shown; findings below may reference images not displayed]

FINDINGS: No CT findings of appendicitis.

No extraluminal bowel inflammatory process, free fluid or free air.
Small bowel loops appear partially dilated without point of
obstruction identified.  Etiology/significance indeterminate.
Additionally, there is mild thickening of the gastric antrum
possibly related to peristalsis.  Primary gastric abnormality would
be difficult to exclude given this appearance.

No calcified gallstones or CT evidence of gallbladder inflammation.
If this is of concern ultrasound may then be considered.

No worrisome hepatic, splenic, pancreatic, renal or adrenal lesion.

No abdominal aortic aneurysm.

No bony destructive lesion.

No hernia noted.

Patchy changes lung bases with a 1.4 cm opacification right middle
lobe.  Findings may represent result of infection.  Recommend
treating any clinically suspected infectious process with follow-up
CT after treatment to exclude a primary lung lesion.
IMPRESSION: No CT findings of appendicitis.

No extraluminal bowel inflammatory process, free fluid or free air.
Small bowel loops appear partially dilated without point of
obstruction identified.  Etiology/significance indeterminate.
Additionally, there is mild thickening of the gastric antrum
possibly related to peristalsis.  Primary gastric abnormality would
be difficult to exclude given this appearance.

Patchy changes lung bases with a 1.4 cm opacification right middle
lobe.  Findings may represent result of infection.  Recommend
treating any clinically suspected infectious process with follow-up
CT after treatment to exclude a primary lung lesion.

## 2014-06-29 IMAGING — CR DG ABDOMEN ACUTE W/ 1V CHEST
4 series · 4 of 4 positions shown · non-contrast
Comparison: Chest radiograph on 10/09/2012

CLINICAL DATA: Mid abdominal pain.  Bronchitis.

ACUTE ABDOMEN SERIES (ABDOMEN 2 VIEW & CHEST 1 VIEW)

[w chest pa]
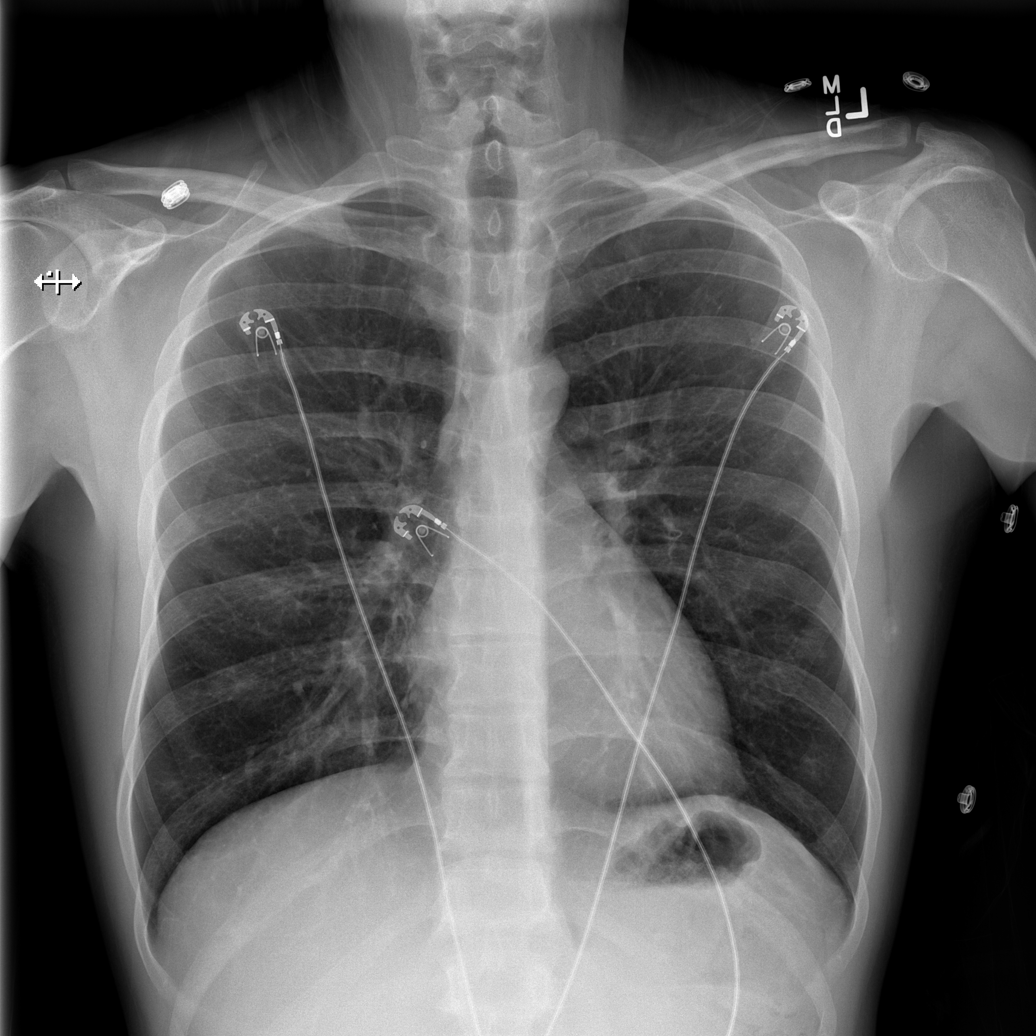

[w abdomen upright]
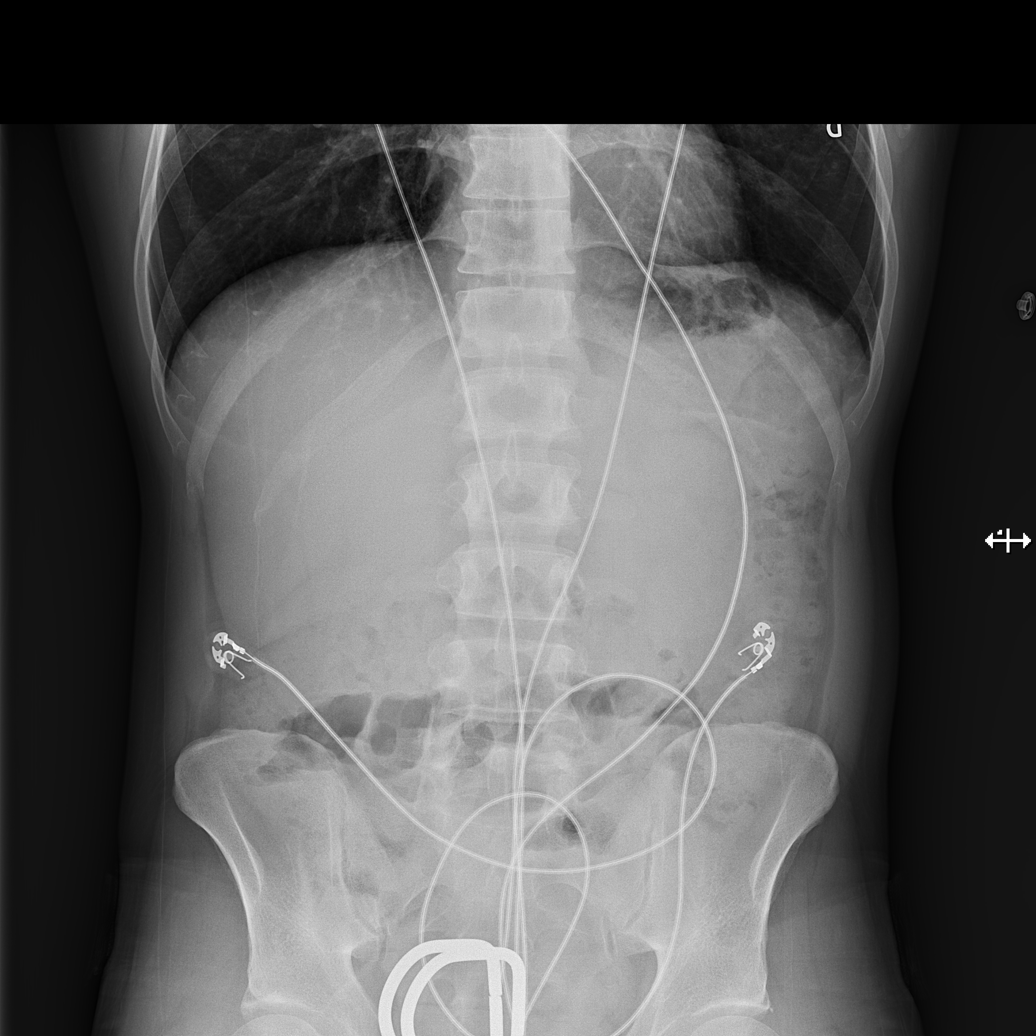

[t abdomen supine (1 of 2)]
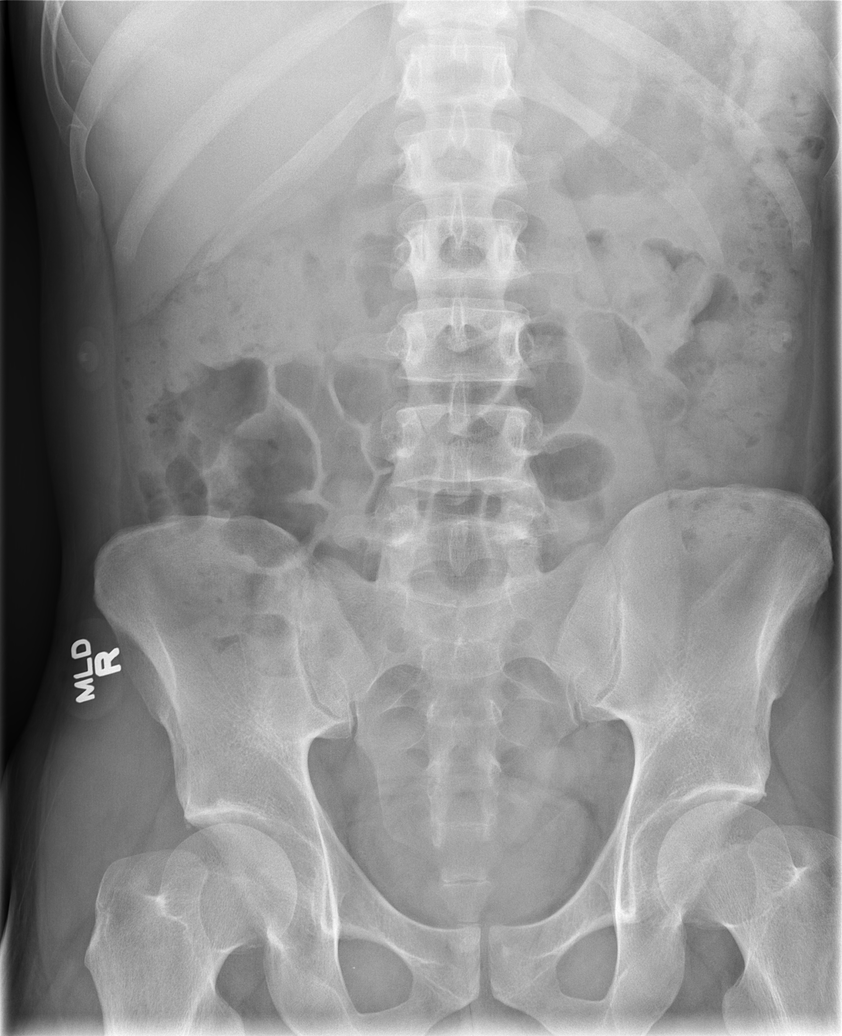

[t abdomen supine (2 of 2)]
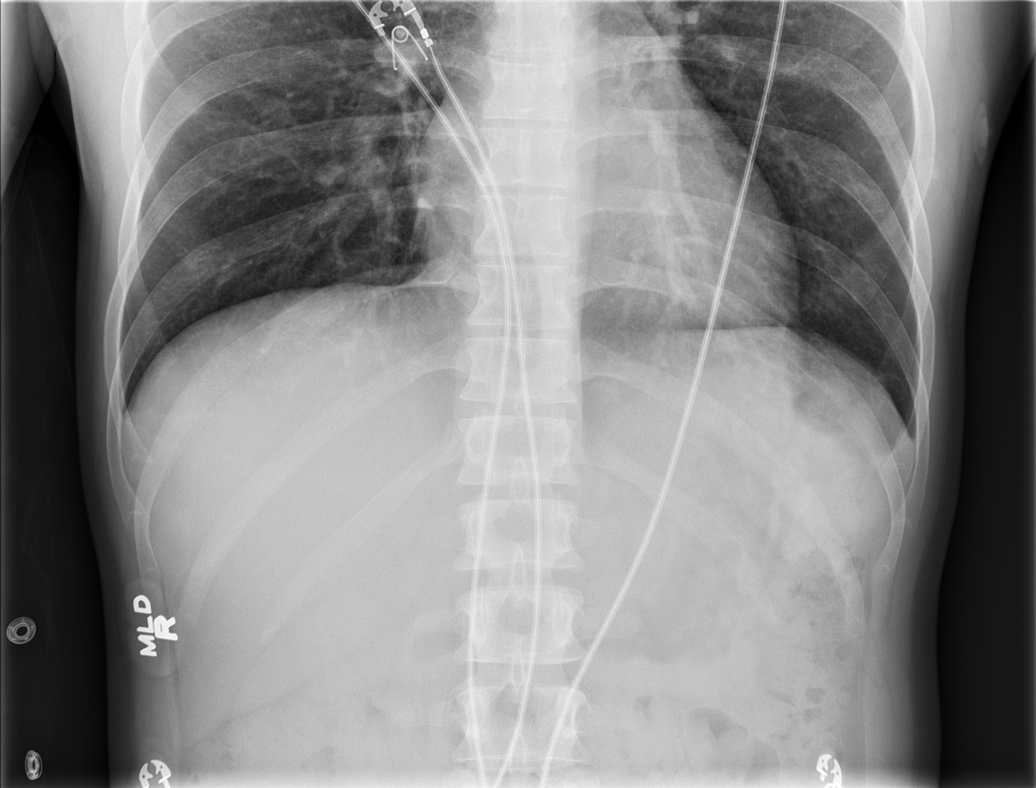

[4 of 4 positions shown; findings below may reference images not displayed]

FINDINGS: There is no evidence of dilated bowel loops or free
intraperitoneal air.  No radiopaque calculi or other significant
radiographic abnormality is seen. Heart size and mediastinal
contours are within normal limits.  Both lungs are clear.
IMPRESSION: Negative abdominal radiographs.  No acute cardiopulmonary disease.

## 2016-05-08 ENCOUNTER — Encounter (HOSPITAL_COMMUNITY): Payer: Self-pay | Admitting: Emergency Medicine

## 2016-05-08 ENCOUNTER — Ambulatory Visit (HOSPITAL_COMMUNITY)
Admission: EM | Admit: 2016-05-08 | Discharge: 2016-05-08 | Disposition: A | Discharge status: Home / Self Care | Payer: No Typology Code available for payment source | Attending: Emergency Medicine | Admitting: Emergency Medicine

## 2016-05-08 DIAGNOSIS — J101 Influenza due to other identified influenza virus with other respiratory manifestations: Secondary | ICD-10-CM | POA: Diagnosis not present

## 2016-05-08 MED ORDER — OSELTAMIVIR PHOSPHATE 75 MG PO CAPS: 75.0000 mg | ORAL_CAPSULE | Freq: Two times a day (BID) | ORAL | 0 refills | Status: DC

## 2016-05-08 MED ORDER — BENZONATATE 100 MG PO CAPS: 100.0000 mg | ORAL_CAPSULE | Freq: Three times a day (TID) | ORAL | 0 refills | Status: DC

## 2016-05-08 MED ORDER — ONDANSETRON 4 MG PO TBDP: 4.0000 mg | ORAL_TABLET | Freq: Three times a day (TID) | ORAL | 0 refills | Status: DC | PRN

## 2016-05-08 NOTE — ED Triage Notes (Signed)
Pt. Stated, my daughter was dx with Influenza B today and I noticed today , Im feeling fatigue with a headache.

## 2016-05-08 NOTE — Discharge Instructions (Signed)
I am treating you for the flu based on your symptoms and exposure to Flu B from your daughter. I have prescribed Tamiflu, take 1 tablet twice a day for 5 days, For Nausea, I have prescribed Zofran, take 1 tablet under the tongue every 8 hours as needed. For cough, I have prescribed a medication called Tessalon. Take 1 tablet every 8 hours as needed for your cough. For fever do tylenol every 4 hours not to exceed 4000mg  a day or ibuprofen. For congestion flonase two sprays each nostril once a day or Claritin or Zyrtec. Should your symptoms worsen or fail to improve in one week follow up with your primary care provider or return to clinic.

## 2016-05-08 NOTE — ED Provider Notes (Signed)
CSN: [id number]     Arrival date & time 05/08/16  1953 History   First MD Initiated Contact with Patient 05/08/16 2032     Chief Complaint  Patient presents with  . Fatigue  . Headache   (Consider location/radiation/quality/duration/timing/severity/associated sxs/prior Treatment) 37 year old male presents to clinic for evaluation of flu like symptoms. He does not have a fever but has had cough and nausea, daughter was diagnosed with Flu B at her pediatrician's office this afternoon. His symptoms have started this evening. Fatigue, body aches, no diarrhea, no fever, has sore throat.    The history is provided by the patient.  Headache    Past Medical History:  Diagnosis Date  . Asthma    mild, intermittent  . Chicken pox   . Depression    Past Surgical History:  Procedure Laterality Date  . REPAIR / RECONSTRUCTION COLLATERAL LIGAMENT HAND  2012  . VASECTOMY  2012   Family History  Problem Relation Age of Onset  . Cancer Mother     breast ca  . Cancer Maternal Grandfather 34    prostate   Social History  Substance Use Topics  . Smoking status: Former Smoker    Quit date: 04/01/2006  . Smokeless tobacco: Former Systems developer  . Alcohol use Yes     Comment: occ - 1 glass of wine    Review of Systems  Reason unable to perform ROS: as covered in HPI.  Neurological: Positive for headaches.  All other systems reviewed and are negative.   Allergies  Patient has no known allergies.  Home Medications   Prior to Admission medications   Medication Sig Start Date End Date Taking? Authorizing Provider  albuterol (PROVENTIL HFA;VENTOLIN HFA) 108 (90 BASE) MCG/ACT inhaler Inhale 1-2 puffs into the lungs every 6 (six) hours as needed for wheezing. 10/09/12   Billy Fischer, MD  benzonatate (TESSALON) 100 MG capsule Take 1 capsule (100 mg total) by mouth every 8 (eight) hours. 05/08/16   Barnet Glasgow, NP  ondansetron (ZOFRAN ODT) 4 MG disintegrating tablet Take 1 tablet (4 mg total) by  mouth every 8 (eight) hours as needed for nausea or vomiting. 05/08/16   Barnet Glasgow, NP  oseltamivir (TAMIFLU) 75 MG capsule Take 1 capsule (75 mg total) by mouth every 12 (twelve) hours. 05/08/16   Barnet Glasgow, NP   Meds Ordered and Administered this Visit  Medications - No data to display  BP 120/87 (BP Location: Right Arm)   Pulse 79   Temp 98.5 F (36.9 C) (Oral)   Resp 17   Ht 5\' 6"  (1.676 m)   Wt 150 lb (68 kg)   SpO2 99%   BMI 24.21 kg/m  No data found.   Physical Exam  Constitutional: He is oriented to person, place, and time. He appears well-developed and well-nourished. No distress.  HENT:  Head: Normocephalic and atraumatic.  Right Ear: Tympanic membrane and external ear normal.  Left Ear: Tympanic membrane and external ear normal.  Nose: Nose normal. Right sinus exhibits no maxillary sinus tenderness and no frontal sinus tenderness. Left sinus exhibits no maxillary sinus tenderness and no frontal sinus tenderness.  Mouth/Throat: Uvula is midline, oropharynx is clear and moist and mucous membranes are normal. No oropharyngeal exudate.  Eyes: Pupils are equal, round, and reactive to light.  Neck: Normal range of motion. Neck supple. No JVD present.  Cardiovascular: Normal rate and regular rhythm.   Pulmonary/Chest: Effort normal and breath sounds normal. No respiratory distress.  He has no wheezes. He has no rhonchi.  Abdominal: Soft. Bowel sounds are normal.  Lymphadenopathy:       Head (right side): No submental, no submandibular and no tonsillar adenopathy present.       Head (left side): No submental, no submandibular and no tonsillar adenopathy present.    He has no cervical adenopathy.  Neurological: He is alert and oriented to person, place, and time.  Skin: Skin is warm and dry. Capillary refill takes less than 2 seconds. No rash noted. He is not diaphoretic. No erythema.  Psychiatric: He has a normal mood and affect.  Nursing note and vitals  reviewed.   Urgent Care Course     Procedures (including critical care time)  Labs Review Labs Reviewed - No data to display  Imaging Review No results found.   Visual Acuity Review  Right Eye Distance:   Left Eye Distance:   Bilateral Distance:    Right Eye Near:   Left Eye Near:    Bilateral Near:         MDM   1. Influenza B   I am treating you for the flu based on your symptoms and exposure to Flu B from your daughter. I have prescribed Tamiflu, take 1 tablet twice a day for 5 days, For Nausea, I have prescribed Zofran, take 1 tablet under the tongue every 8 hours as needed. For cough, I have prescribed a medication called Tessalon. Take 1 tablet every 8 hours as needed for your cough. For fever do tylenol every 4 hours not to exceed 4000mg  a day or ibuprofen. For congestion flonase two sprays each nostril once a day or Claritin or Zyrtec. Should your symptoms worsen or fail to improve in one week follow up with your primary care provider or return to clinic.     Barnet Glasgow, NP 05/08/16 2055

## 2016-11-15 ENCOUNTER — Encounter: Payer: Self-pay | Admitting: Family Medicine

## 2016-11-15 ENCOUNTER — Ambulatory Visit (INDEPENDENT_AMBULATORY_CARE_PROVIDER_SITE_OTHER): Payer: No Typology Code available for payment source | Admitting: Family Medicine

## 2016-11-15 VITALS — BP 100/78 | HR 84 | Temp 98.8°F | Ht 66.0 in | Wt 148.6 lb

## 2016-11-15 DIAGNOSIS — J029 Acute pharyngitis, unspecified: Secondary | ICD-10-CM | POA: Diagnosis not present

## 2016-11-15 LAB — POCT RAPID STREP A (OFFICE): RAPID STREP A SCREEN: NEGATIVE

## 2016-11-15 NOTE — Progress Notes (Signed)
HPI:  Acute visit for sore throat: -started:sore throat yesterday, scratchy throat today -other symptoms:nasal congestion, PND, occ cough -denies:fever, SOB, NVD, tooth pain -has tried: nothing -sick contacts/travel/risks: a colleague had strep - no close contacts  Going thru divorce. Married 10 years. Has 3 kids. He is doing ok as it is mutual but he is sad about his youngest child going thru this. Considering counseling. No thoughts of harm.  ROS: See pertinent positives and negatives per HPI.  Past Medical History:  Diagnosis Date  . Asthma    mild, intermittent  . Chicken pox   . Depression     Past Surgical History:  Procedure Laterality Date  . REPAIR / RECONSTRUCTION COLLATERAL LIGAMENT HAND  2012  . VASECTOMY  2012    Family History  Problem Relation Age of Onset  . Cancer Mother        breast ca  . Cancer Maternal Grandfather 74       prostate    Social History   Social History  . Marital status: Single    Spouse name: N/A  . Number of children: N/A  . Years of education: N/A   Social History Main Topics  . Smoking status: Former Smoker    Quit date: 04/01/2006  . Smokeless tobacco: Former Systems developer  . Alcohol use Yes     Comment: occ - 1 glass of wine  . Drug use: No  . Sexual activity: Yes     Comment: with wife   Other Topics Concern  . 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  albuterol (PROVENTIL HFA;VENTOLIN HFA) 108 (90 BASE) MCG/ACT inhaler, Inhale 1-2 puffs into the lungs every 6 (six) hours as needed for wheezing., Disp: 1 Inhaler, Rfl: 1  EXAM:  Vitals:   11/15/16 1613  BP: 100/78  Pulse: 84  Temp: 98.8 F (37.1 C)    Body mass index is 23.98  kg/m.  GENERAL: vitals reviewed and listed above, alert, oriented, appears well hydrated and in no acute distress  HEENT: atraumatic, conjunttiva clear, no obvious abnormalities on inspection of external nose and ears, normal appearance of ear canals and TMs, clear nasal congestion, mild post oropharyngeal erythema with PND, no tonsillar edema or exudate, no sinus TTP  NECK: no obvious masses on inspection  LUNGS: clear to auscultation bilaterally, no wheezes, rales or rhonchi, good air movement  CV: HRRR, no peripheral edema  MS: moves all extremities without noticeable abnormality  PSYCH: pleasant and cooperative, no obvious depression or anxiety  ASSESSMENT AND PLAN:  Discussed the following assessment and plan:  Sore throat - Plan: POC Rapid Strep A -given HPI and exam findings today, a serious infection or illness is unlikely. We discussed potential etiologies, with VURI being most likely, and advised supportive care and monitoring. We discussed treatment side effects, likely course, antibiotic misuse, transmission, and signs of developing a serious illness.  Stress, Marital: -advised CBT and brochure given to call to schedule  -of course, we advised to return or notify a doctor immediately if symptoms worsen or persist or new concerns  arise.    There are no Patient Instructions on file for this visit.  Colin Benton R., DO

## 2016-11-15 NOTE — Patient Instructions (Signed)
We have ordered labs or studies at this visit. It can take up to 3-4 days for results and processing. IF results require follow up or explanation, we will call you with instructions. Clinically stable results will be released to your [facility]. If you have not heard from [place] or cannot find your results in [facility] in 3-4 days please contact our office at [phone].  If you are not yet signed up for [facility], please consider signing up.  Please consider counseling.   I hope you are feeling better soon. Follow up if worsening, new concerns or you are not improving with treatment.

## 2016-11-17 LAB — CULTURE, GROUP A STREP

## 2016-12-19 ENCOUNTER — Encounter: Payer: Self-pay | Admitting: Family Medicine

## 2017-04-10 ENCOUNTER — Encounter: Payer: Self-pay | Admitting: Family Medicine

## 2017-04-23 ENCOUNTER — Encounter: Payer: Self-pay | Admitting: Adult Health

## 2017-04-23 ENCOUNTER — Ambulatory Visit: Payer: No Typology Code available for payment source | Admitting: Adult Health

## 2017-04-23 VITALS — BP 116/80 | Temp 98.5°F | Wt 155.0 lb

## 2017-04-23 DIAGNOSIS — J029 Acute pharyngitis, unspecified: Secondary | ICD-10-CM

## 2017-04-23 LAB — POCT RAPID STREP A (OFFICE): Rapid Strep A Screen: NEGATIVE

## 2017-04-23 MED ORDER — PENICILLIN G BENZATHINE 1200000 UNIT/2ML IM SUSP: 1.2000 10*6.[IU] | Freq: Once | INTRAMUSCULAR | Status: DC

## 2017-04-23 MED ORDER — PENICILLIN G BENZATHINE 1200000 UNIT/2ML IM SUSP
1.2000 10*6.[IU] | Freq: Once | INTRAMUSCULAR | Status: AC
  Administered 2017-04-23: 1.2 10*6.[IU] via INTRAMUSCULAR

## 2017-04-23 NOTE — Progress Notes (Signed)
Subjective:    Patient ID: Frank Schroeder, male    DOB: Aug 29, 1980, 37 y.o.   MRN: [id number]  Sore Throat   This is a new problem. The current episode started yesterday. The problem has been gradually worsening. There has been no fever (subjective fevers and chills ). Associated symptoms include a hoarse voice. Pertinent negatives include no congestion, coughing, ear discharge, neck pain, stridor or trouble swallowing. He has had exposure to strep. He has had no exposure to mono. He has tried nothing for the symptoms.    Review of Systems  HENT: Positive for hoarse voice. Negative for congestion, ear discharge and trouble swallowing.   Respiratory: Negative for cough and stridor.   Musculoskeletal: Negative for neck pain.   Past Medical History:  Diagnosis Date  . Asthma    mild, intermittent  . Chicken pox   . Depression     Social History   Socioeconomic History  . Marital status: Single    Spouse name: Not on file  . Number of children: Not on file  . Years of education: Not on file  . Highest education level: Not on file  Social Needs  . Financial resource strain: Not on file  . Food insecurity - worry: Not on file  . Food insecurity - inability: Not on file  . Transportation needs - medical: Not on file  . Transportation needs - non-medical: Not on file  Occupational History  . Not on file  Tobacco Use  . Smoking status: Former Smoker    Last attempt to quit: 04/01/2006    Years since quitting: 11.0  . Smokeless tobacco: Former Network engineer and Sexual Activity  . Alcohol use: Yes    Comment: occ - 1 glass of wine  . Drug use: No  . Sexual activity: Yes    Comment: with wife  Other Topics Concern  . 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 REPAIR / RECONSTRUCTION COLLATERAL LIGAMENT HAND  2012  . VASECTOMY  2012    Family History  Problem Relation Age of Onset  . Cancer Mother        breast ca  . Cancer Maternal Grandfather 21       prostate    No Known Allergies  No current outpatient medications on file prior to visit.   No current facility-administered medications on file prior to visit.     BP 116/80 (BP Location: Left Arm)   Temp 98.5 F (36.9 C) (Oral)   Wt 155 lb (70.3 kg)   BMI 25.02 kg/m       Objective:   Physical Exam  Constitutional: He is oriented to person, place, and time. He appears well-developed and well-nourished. No distress.  HENT:  Head: Normocephalic and atraumatic.  Right Ear: External ear normal.  Left Ear: External ear normal.  Nose: Nose normal.  Mouth/Throat: Mucous membranes are normal. Oropharyngeal exudate, posterior oropharyngeal edema and posterior oropharyngeal erythema present.  Neck: Normal range of motion. Neck supple. No thyromegaly present.  Cardiovascular: Normal rate, regular  rhythm, normal heart sounds and intact distal pulses. Exam reveals no gallop and no friction rub.  No murmur heard. Pulmonary/Chest: Effort normal and breath sounds normal. No respiratory distress. He has no wheezes. He has no rales. He exhibits no tenderness.  Lymphadenopathy:    He has no cervical adenopathy.  Neurological: He is alert and oriented to person, place, and time.  Skin: Skin is warm and dry. No rash noted. He is not diaphoretic. No erythema. No pallor.  Psychiatric: He has a normal mood and affect. His behavior is normal. Judgment and thought content normal.  Vitals reviewed.     Assessment & Plan:  1. Sore throat  - POC Rapid Strep A- negative  - Centor Score 3  - Due to signs and symptoms, his son being diagnosed with strep. Will treat for strep - penicillin g benzathine (BICILLIN LA)  1200000 UNIT/2ML injection 1.2 Million Units - Follow up if not resolved in 2-3 days   Dorothyann Peng, NP

## 2018-05-05 ENCOUNTER — Telehealth: Payer: Self-pay | Admitting: Family Medicine

## 2018-05-05 NOTE — Telephone Encounter (Signed)
Copied from [place] [id number]. Topic: General - Other >> May 05, 2018  4:52 PM Keene Breath wrote: Reason for CRM: Patient is calling because his daughter was diagnosed with the flu and her pediatrician asked him to call his PCP to get [facility] flu.  Please send it to [place] [place] [facility] [place] [phone] (Phone) [phone] (Fax).  If there are any questions, please call patient at [phone]

## 2018-05-06 ENCOUNTER — Other Ambulatory Visit: Payer: Self-pay | Admitting: Family Medicine

## 2018-05-06 MED ORDER — OSELTAMIVIR PHOSPHATE 75 MG PO CAPS: 75.0000 mg | ORAL_CAPSULE | Freq: Every day | ORAL | 0 refills | Status: AC

## 2018-05-06 NOTE — Telephone Encounter (Signed)
I called the pt and informed him of the message below. 

## 2018-05-06 NOTE — Telephone Encounter (Signed)
sent 

## 2018-08-19 ENCOUNTER — Encounter: Payer: Self-pay | Admitting: Family Medicine

## 2018-08-19 NOTE — Telephone Encounter (Signed)
Frank Schroeder,   Please see if pt can talk with me today (thurs) via doxy so that I can get things set up for him. Thanks!

## 2018-08-19 NOTE — Telephone Encounter (Signed)
Unfortunately we can't see the teledoc visit (I don't think in our system or in care everywhere); so not sure exact issue. I would forward to [facility] but see if he can give [place] name of where he did visit so we might be able to track down?

## 2018-08-20 NOTE — Telephone Encounter (Signed)
I left a detailed message at the pts cell number with the information below and asked that he call back for a phone visit today.  Message also sent via [place].

## 2018-08-21 ENCOUNTER — Telehealth: Payer: Self-pay | Admitting: *Deleted

## 2018-08-21 NOTE — Telephone Encounter (Signed)
Copied from [place] [id number]. Topic: General - Inquiry >> Aug 21, 2018  2:38 PM Virl Axe D wrote: Reason for CRM: Pt stated he was advised to have an xray an PT. He was calling to see how this can be set up. Please advise. CB#[phone]

## 2018-08-25 ENCOUNTER — Encounter: Payer: Self-pay | Admitting: Family Medicine

## 2018-08-25 ENCOUNTER — Telehealth: Payer: Self-pay | Admitting: *Deleted

## 2018-08-25 ENCOUNTER — Ambulatory Visit (INDEPENDENT_AMBULATORY_CARE_PROVIDER_SITE_OTHER): Payer: No Typology Code available for payment source | Admitting: Family Medicine

## 2018-08-25 ENCOUNTER — Other Ambulatory Visit: Payer: Self-pay

## 2018-08-25 DIAGNOSIS — M545 Low back pain, unspecified: Secondary | ICD-10-CM

## 2018-08-25 MED ORDER — TIZANIDINE HCL 2 MG PO TABS: 2.0000 mg | ORAL_TABLET | Freq: Every evening | ORAL | 0 refills | Status: DC | PRN

## 2018-08-25 NOTE — Patient Instructions (Signed)
Will have my assistant send you some home physical therapy exercises per your request. Please do these 4 days per week.  Can use the muscle relaxer, tizanidine, once nightly before bed as needed for muscle spasm and pain.  Can use heat, topical menthol (tiger balm is a good option), Aleve or tylenol as needed per instructions for pain.  Follow up in 3-4 weeks. I hope you are feeling better soon! Seek care sooner if your symptoms worsen or new concerns arise.

## 2018-08-25 NOTE — Telephone Encounter (Signed)
-----   Message from Lucretia Kern, DO sent at 08/25/2018 11:49 AM EDT ----- -follow up with Dr. Maudie Mercury in 3-4 weeks -HEP for low back pain - mail to his house

## 2018-08-25 NOTE — Telephone Encounter (Signed)
I called the pt and informed him the exercises were mailed to his home address.  Patient stated he will call back for a follow up phone visit if needed.

## 2018-08-25 NOTE — Progress Notes (Signed)
Virtual Visit via Video Note  I connected with Race  on 08/25/18 at 11:20 AM EDT by a video enabled telemedicine application and verified that I am speaking with the correct person using two identifiers.  Location patient: home Location provider:work or home office Persons participating in the virtual visit: patient, provider  I discussed the limitations of evaluation and management by telemedicine and the availability of in person appointments. The patient expressed understanding and agreed to proceed.   HPI:  Acute visit for back pain: -started in March after daughter (48 yo) jumped on his low back -had some pain initially then worse the next day -he called [facility] and was given prednisone and meloxicam and pain resolved -pain returned about 1.5 weeks ago after some lifting; did another [facility] visit and got meloxicam and PT/xray were discussed; now improved again with only mild symptoms today - pain is upper low back paraspinal muscle area, non-radiating, no weakness/numbness/malaise/fever/bowel or bladder dysfunction   ROS: See pertinent positives and negatives per HPI.  Past Medical History:  Diagnosis Date  . Asthma    mild, intermittent  . Chicken pox   . Depression     Past Surgical History:  Procedure Laterality Date  . REPAIR / RECONSTRUCTION COLLATERAL LIGAMENT HAND  2012  . VASECTOMY  2012    Family History  Problem Relation Age of Onset  . Cancer Mother        breast ca  . Cancer Maternal Grandfather 14       prostate    SOCIAL HX: see hpi  No current outpatient medications on file.  EXAM:  VITALS per patient if applicable:  GENERAL: alert, oriented, appears well and in no acute distress  HEENT: atraumatic, conjunttiva clear, no obvious abnormalities on inspection of external nose and ears  NECK: normal movements of the head and neck  LUNGS: on inspection no signs of respiratory distress, breathing rate appears normal, no obvious gross SOB,  gasping or wheezing  CV: no obvious cyanosis  MS: moves all visible extremities without noticeable abnormality, points to paraspinal muscles in upper lumbar region as area of concern with very little TTP today on self exam neg SLRT/CLRT, normal movements getting out of chair  PSYCH/NEURO: pleasant and cooperative, no obvious depression or anxiety, speech and thought processing grossly intact  ASSESSMENT AND PLAN:  Discussed the following assessment and plan:  Acute bilateral low back pain without sciatica  -we discussed possible serious and likely etiologies, workup and treatment, treatment risks and return precautions. Suspect muscular. -after this discussion, Beuford opted for HEP instead of PT given pandemia, he declined xray for now as is doing better, symptomatic care - muscle relaxer and see pt instructions. -follow up advised 3-4 weeks -of course, we advised Dalessandro  to return or notify a doctor immediately if symptoms worsen or persist or new concerns arise.  I discussed the assessment and treatment plan with the patient. The patient was provided an opportunity to ask questions and all were answered. The patient agreed with the plan and demonstrated an understanding of the instructions.   Follow up instructions: Advised assistant Wendie Simmer to help patient arrange the following: -follow up with Dr. Maudie Mercury in 3-4 weeks -HEP for low back pain - mail to his house  Lucretia Kern, DO

## 2018-08-25 NOTE — Telephone Encounter (Signed)
Frank Schroeder,  Needs doxy.me to discuss/exam. Can work in today.

## 2018-08-25 NOTE — Telephone Encounter (Signed)
I called the pt and scheduled a visit at 11:20am today.

## 2018-09-11 ENCOUNTER — Other Ambulatory Visit: Payer: Self-pay

## 2018-09-11 ENCOUNTER — Encounter: Payer: Self-pay | Admitting: Family Medicine

## 2018-09-11 ENCOUNTER — Ambulatory Visit (INDEPENDENT_AMBULATORY_CARE_PROVIDER_SITE_OTHER): Payer: No Typology Code available for payment source | Admitting: Family Medicine

## 2018-09-11 ENCOUNTER — Ambulatory Visit: Payer: No Typology Code available for payment source | Admitting: Adult Health

## 2018-09-11 VITALS — BP 120/78 | HR 89 | Temp 98.9°F | Wt 153.0 lb

## 2018-09-11 DIAGNOSIS — H1031 Unspecified acute conjunctivitis, right eye: Secondary | ICD-10-CM | POA: Diagnosis not present

## 2018-09-11 MED ORDER — TOBRAMYCIN-DEXAMETHASONE 0.3-0.1 % OP SUSP: 2.0000 [drp] | OPHTHALMIC | 0 refills | Status: DC

## 2018-09-11 NOTE — Progress Notes (Signed)
   Subjective:    Patient ID: Frank Schroeder, male    DOB: [birth year], 38 y.o.   MRN: [id number]  HPI Here for redness and irritation in the right eye which started 4 days ago. He was working in his yard, mowing and blowing, when he felt a sudden irritation in the eye. Over the next 24 hours the eye became painful, red, and his lids crusted together. His vision is unaffected other than tearing. He took part in a Teledoc session yesterday and was prescribed Ofloxacin drops. Today he is still no better. He denies a foreign object sensation in the eye.    Review of Systems  Constitutional: Negative.   HENT: Negative.   Eyes: Positive for pain, discharge and redness. Negative for photophobia and visual disturbance.  Respiratory: Negative.        Objective:   Physical Exam Constitutional:      Appearance: Normal appearance.  HENT:     Head: Normocephalic and atraumatic.     Right Ear: Tympanic membrane, ear canal and external ear normal.     Left Ear: Tympanic membrane, ear canal and external ear normal.     Nose: Nose normal.     Mouth/Throat:     Pharynx: Oropharynx is clear.  Eyes:     Pupils: Pupils are equal, round, and reactive to light.     Comments: Right conjunctiva is slightly red, no foreign bodies are seen. The exam is normal under a Woods lamp with fluorescein applied. No abrasions are seen. The left eye is clear  Cardiovascular:     Rate and Rhythm: Normal rate and regular rhythm.     Pulses: Normal pulses.     Heart sounds: Normal heart sounds.  Pulmonary:     Effort: Pulmonary effort is normal.     Breath sounds: Normal breath sounds.  Lymphadenopathy:     Cervical: No cervical adenopathy.  Neurological:     Mental Status: He is alert.           Assessment & Plan:  It seems likely he had a foreign object in the eye which we cannot see. The eye was flushed liberally with sterile water. He will wear a patch for 24 hours and apply Tobradex drops. Recheck prn.   Alysia Penna, MD

## 2018-09-28 ENCOUNTER — Encounter: Payer: Self-pay | Admitting: Family Medicine

## 2018-09-28 ENCOUNTER — Other Ambulatory Visit: Payer: Self-pay

## 2018-09-28 ENCOUNTER — Ambulatory Visit (INDEPENDENT_AMBULATORY_CARE_PROVIDER_SITE_OTHER): Payer: No Typology Code available for payment source | Admitting: Family Medicine

## 2018-09-28 DIAGNOSIS — H5711 Ocular pain, right eye: Secondary | ICD-10-CM | POA: Diagnosis not present

## 2018-09-28 NOTE — Progress Notes (Signed)
   Subjective:    Patient ID: Frank Schroeder, male    DOB: [birth year], 38 y.o.   MRN: [id number]  HPI Here for continuing problems in the right eye. About 3 weeks ago he felt something blow into his right eye while he was out working in his yard. He quickly developed pain and redness in the eye. He was seen here on 09-11-18 and on exam no foreign bodies or corneal abrasions were seen. He was given Tobradex drops, and these helped. His redness went away and the pain improved, but some discomfort remains. He also notes some mild blurriness of vision on the right side.  Virtual Visit via Video Note  I connected with the patient on 09/28/18 at  3:30 PM EDT by a video enabled telemedicine application and verified that I am speaking with the correct person using two identifiers.  Location patient: home Location provider:work or home office Persons participating in the virtual visit: patient, provider  I discussed the limitations of evaluation and management by telemedicine and the availability of in person appointments. The patient expressed understanding and agreed to proceed.   HPI:    ROS: See pertinent positives and negatives per HPI.  Past Medical History:  Diagnosis Date  . Asthma    mild, intermittent  . Chicken pox   . Depression     Past Surgical History:  Procedure Laterality Date  . REPAIR / RECONSTRUCTION COLLATERAL LIGAMENT HAND  2012  . VASECTOMY  2012    Family History  Problem Relation Age of Onset  . Cancer Mother        breast ca  . Cancer Maternal Grandfather 40       prostate     Current Outpatient Medications:  .  tobramycin-dexamethasone (TOBRADEX) ophthalmic solution, Place 2 drops into the right eye every 4 (four) hours while awake., Disp: 5 mL, Rfl: 0  EXAM:  VITALS per patient if applicable:  GENERAL: alert, oriented, appears well and in no acute distress  HEENT: atraumatic, conjunttiva clear, no obvious abnormalities on inspection of external  nose and ears  NECK: normal movements of the head and neck  LUNGS: on inspection no signs of respiratory distress, breathing rate appears normal, no obvious gross SOB, gasping or wheezing  CV: no obvious cyanosis  MS: moves all visible extremities without noticeable abnormality  PSYCH/NEURO: pleasant and cooperative, no obvious depression or anxiety, speech and thought processing grossly intact  ASSESSMENT AND PLAN: Right eye pain. We will refer him to Ophthalmology to evaluate.  Alysia Penna, MD  Discussed the following assessment and plan:  Pain in right eye - Plan: Ambulatory referral to Ophthalmology     I discussed the assessment and treatment plan with the patient. The patient was provided an opportunity to ask questions and all were answered. The patient agreed with the plan and demonstrated an understanding of the instructions.   The patient was advised to call back or seek an in-person evaluation if the symptoms worsen or if the condition fails to improve as anticipated.    Review of Systems     Objective:   Physical Exam        Assessment & Plan:

## 2018-11-27 ENCOUNTER — Other Ambulatory Visit: Payer: Self-pay

## 2018-11-27 ENCOUNTER — Telehealth: Payer: No Typology Code available for payment source | Admitting: Family Medicine

## 2019-04-19 ENCOUNTER — Other Ambulatory Visit: Payer: Self-pay

## 2019-04-19 ENCOUNTER — Encounter (HOSPITAL_COMMUNITY): Payer: Self-pay

## 2019-04-19 ENCOUNTER — Ambulatory Visit (HOSPITAL_COMMUNITY)
Admission: EM | Admit: 2019-04-19 | Discharge: 2019-04-19 | Disposition: A | Discharge status: Home / Self Care | Payer: No Typology Code available for payment source | Attending: Family Medicine | Admitting: Family Medicine

## 2019-04-19 DIAGNOSIS — Z23 Encounter for immunization: Secondary | ICD-10-CM

## 2019-04-19 DIAGNOSIS — W450XXA Nail entering through skin, initial encounter: Secondary | ICD-10-CM | POA: Diagnosis not present

## 2019-04-19 DIAGNOSIS — S91332A Puncture wound without foreign body, left foot, initial encounter: Secondary | ICD-10-CM

## 2019-04-19 MED ORDER — TETANUS-DIPHTH-ACELL PERTUSSIS 5-2.5-18.5 LF-MCG/0.5 IM SUSP
INTRAMUSCULAR | Status: AC
  Filled 2019-04-19: qty 0.5

## 2019-04-19 MED ORDER — TETANUS-DIPHTH-ACELL PERTUSSIS 5-2.5-18.5 LF-MCG/0.5 IM SUSP
0.5000 mL | Freq: Once | INTRAMUSCULAR | Status: AC
  Administered 2019-04-19: 0.5 mL via INTRAMUSCULAR

## 2019-04-19 MED ORDER — CEPHALEXIN 500 MG PO CAPS: 500.0000 mg | ORAL_CAPSULE | Freq: Four times a day (QID) | ORAL | 0 refills | Status: AC

## 2019-04-19 NOTE — Discharge Instructions (Signed)
Tetanus updated today Keep clean and dry Ice and elevate Monitor for signs of infection, developing redness swelling increased pain or drainage from the area please fill prescription for Keflex, if symptoms still worsening despite taking antibiotic please follow-up in person

## 2019-04-19 NOTE — ED Triage Notes (Signed)
Pt step on a rusty nail, he is here to get an tetanus shot

## 2019-04-21 NOTE — ED Provider Notes (Signed)
[facility]    CSN: [id number] Arrival date & time: 04/19/19  [place]      History   Chief Complaint Chief Complaint  Patient presents with  . Foreign Body    rusty nail     HPI COCHISE WESP is a 39 y.o. male history of asthma, presenting today for evaluation of puncture wound.  Patient states that he stepped on a rusty nail over the weekend.  Nail punctured through his shoe and into his foot.  Does not believe he has any remaining foreign body in his foot, is not concerned about fracture.  Has had some soreness around the area, but this is improving.  His main concern is updating his tetanus today.  Believes his last tetanus was approximately 9 years ago.   HPI  Past Medical History:  Diagnosis Date  . Asthma    mild, intermittent  . Chicken pox   . Depression     Patient Active Problem List   Diagnosis Date Noted  . RAD (reactive airway disease) with wheezing 10/15/2012    Past Surgical History:  Procedure Laterality Date  . REPAIR / RECONSTRUCTION COLLATERAL LIGAMENT HAND  2012  . VASECTOMY  2012       Home Medications    Prior to Admission medications   Medication Sig Start Date End Date Taking? Authorizing Provider  cephALEXin (KEFLEX) 500 MG capsule Take 1 capsule (500 mg total) by mouth 4 (four) times daily for 5 days. 04/19/19 04/24/19  Glenetta Kiger C, PA-C  tobramycin-dexamethasone (TOBRADEX) ophthalmic solution Place 2 drops into the right eye every 4 (four) hours while awake. 09/11/18   Laurey Morale, MD    Family History Family History  Problem Relation Age of Onset  . Cancer Mother        breast ca  . Cancer Maternal Grandfather 17       prostate    Social History Social History   Tobacco Use  . Smoking status: Former Smoker    Quit date: 04/01/2006    Years since quitting: 13.0  . Smokeless tobacco: Former Network engineer Use Topics  . Alcohol use: Yes    Comment: occ - 1 glass of wine  . Drug use: No     Allergies     Patient has no known allergies.   Review of Systems Review of Systems  Constitutional: Negative for fatigue and fever.  Eyes: Negative for redness, itching and visual disturbance.  Respiratory: Negative for shortness of breath.   Cardiovascular: Negative for chest pain and leg swelling.  Gastrointestinal: Negative for nausea and vomiting.  Musculoskeletal: Negative for arthralgias and myalgias.  Skin: Positive for wound. Negative for color change and rash.  Neurological: Negative for dizziness, syncope, weakness, light-headedness and headaches.     Physical Exam Triage Vital Signs ED Triage Vitals [04/19/19 1818]  Enc Vitals Group     BP 132/87     Pulse Rate 90     Resp 16     Temp 99 F (37.2 C)     Temp src      SpO2 100 %     Weight      Height      Head Circumference      Peak Flow      Pain Score 0     Pain Loc      Pain Edu?      Excl. in [place]?    No data found.  Updated Vital Signs  BP 132/87 (BP Location: Left Arm)   Pulse 90   Temp 99 F (37.2 C)   Resp 16   SpO2 100%   Visual Acuity Right Eye Distance:   Left Eye Distance:   Bilateral Distance:    Right Eye Near:   Left Eye Near:    Bilateral Near:     Physical Exam Vitals and nursing note reviewed.  Constitutional:      Appearance: He is well-developed.     Comments: No acute distress  HENT:     Head: Normocephalic and atraumatic.     Nose: Nose normal.  Eyes:     Conjunctiva/sclera: Conjunctivae normal.  Cardiovascular:     Rate and Rhythm: Normal rate.  Pulmonary:     Effort: Pulmonary effort is normal. No respiratory distress.  Abdominal:     General: There is no distension.  Musculoskeletal:        General: Normal range of motion.     Cervical back: Neck supple.     Comments: Nontender to palpation over dorsum of foot, dorsalis pedis 2+  Skin:    General: Skin is warm and dry.     Comments: Left plantar surface of foot with puncture wound over ball of foot underneath great  toe; no palpable foreign body, no surrounding erythema, no drainage noted  Neurological:     Mental Status: He is alert and oriented to person, place, and time.      UC Treatments / Results  Labs (all labs ordered are listed, but only abnormal results are displayed) Labs Reviewed - No data to display  EKG   Radiology No results found.  Procedures Procedures (including critical care time)  Medications Ordered in UC Medications  Tdap (BOOSTRIX) injection 0.5 mL (0.5 mLs Intramuscular Given 04/19/19 1840)    Initial Impression / Assessment and Plan / UC Course  I have reviewed the triage vital signs and the nursing notes.  Pertinent labs & imaging results that were available during my care of the patient were reviewed by me and considered in my medical decision making (see chart for details).     Updating tetanus today, providing printed prescription for Keflex to fill if developing signs of infection, at this time continue wound care of cleaning twice daily.  Do not suspect underlying fracture.  Ice and elevate to help with any swelling.Discussed strict return precautions. Patient verbalized understanding and is agreeable with plan.  Final Clinical Impressions(s) / UC Diagnoses   Final diagnoses:  Puncture wound of left foot, initial encounter     Discharge Instructions     Tetanus updated today Keep clean and dry Ice and elevate Monitor for signs of infection, developing redness swelling increased pain or drainage from the area please fill prescription for Keflex, if symptoms still worsening despite taking antibiotic please follow-up in person   ED Prescriptions    Medication Sig Dispense Auth. Provider   cephALEXin (KEFLEX) 500 MG capsule Take 1 capsule (500 mg total) by mouth 4 (four) times daily for 5 days. 20 capsule Jett Kulzer, [place] C, PA-C     PDMP not reviewed this encounter.   Stefhanie Kachmar, [place] C, PA-C 04/21/19 1017

## 2019-05-03 ENCOUNTER — Ambulatory Visit: Payer: No Typology Code available for payment source | Attending: Internal Medicine

## 2019-05-03 DIAGNOSIS — Z20822 Contact with and (suspected) exposure to covid-19: Secondary | ICD-10-CM

## 2019-05-05 LAB — NOVEL CORONAVIRUS, NAA: SARS-CoV-2, NAA: NOT DETECTED

## 2019-05-07 ENCOUNTER — Ambulatory Visit: Payer: No Typology Code available for payment source | Attending: Internal Medicine

## 2019-05-07 DIAGNOSIS — Z20822 Contact with and (suspected) exposure to covid-19: Secondary | ICD-10-CM

## 2019-05-09 ENCOUNTER — Encounter: Payer: Self-pay | Admitting: Family Medicine

## 2019-05-09 LAB — NOVEL CORONAVIRUS, NAA: SARS-CoV-2, NAA: NOT DETECTED

## 2019-06-01 ENCOUNTER — Ambulatory Visit: Payer: No Typology Code available for payment source | Attending: Family

## 2019-06-01 DIAGNOSIS — Z20822 Contact with and (suspected) exposure to covid-19: Secondary | ICD-10-CM

## 2019-06-02 LAB — NOVEL CORONAVIRUS, NAA: SARS-CoV-2, NAA: NOT DETECTED

## 2020-04-15 ENCOUNTER — Telehealth: Payer: No Typology Code available for payment source | Admitting: Nurse Practitioner

## 2020-04-15 DIAGNOSIS — R112 Nausea with vomiting, unspecified: Secondary | ICD-10-CM | POA: Diagnosis not present

## 2020-04-15 MED ORDER — ONDANSETRON HCL 4 MG PO TABS: 4.0000 mg | ORAL_TABLET | Freq: Three times a day (TID) | ORAL | 0 refills | Status: DC | PRN

## 2020-04-15 NOTE — Progress Notes (Signed)

## 2020-07-10 ENCOUNTER — Telehealth: Payer: No Typology Code available for payment source | Admitting: Emergency Medicine

## 2020-07-10 ENCOUNTER — Encounter: Payer: Self-pay | Admitting: Emergency Medicine

## 2020-07-10 ENCOUNTER — Telehealth: Payer: Self-pay | Admitting: Family Medicine

## 2020-07-10 VITALS — Temp 101.7°F

## 2020-07-10 DIAGNOSIS — U071 COVID-19: Secondary | ICD-10-CM | POA: Diagnosis not present

## 2020-07-10 MED ORDER — ALBUTEROL SULFATE HFA 108 (90 BASE) MCG/ACT IN AERS: 2.0000 | INHALATION_SPRAY | Freq: Four times a day (QID) | RESPIRATORY_TRACT | 2 refills | Status: AC | PRN

## 2020-07-10 NOTE — Progress Notes (Signed)
Mr. Frank, Schroeder are scheduled for a virtual visit with your provider today.    Just as we do with appointments in the office, we must obtain your consent to participate.  Your consent will be active for this visit and any virtual visit you may have with one of our providers in the next 365 days.    If you have a MyChart account, I can also send a copy of this consent to you electronically.  All virtual visits are billed to your insurance company just like a traditional visit in the office.  As this is a virtual visit, video technology does not allow for your provider to perform a traditional examination.  This may limit your provider's ability to fully assess your condition.  If your provider identifies any concerns that need to be evaluated in person or the need to arrange testing such as labs, EKG, etc, we will make arrangements to do so.    Although advances in technology are sophisticated, we cannot ensure that it will always work on either your end or our end.  If the connection with a video visit is poor, we may have to switch to a telephone visit.  With either a video or telephone visit, we are not always able to ensure that we have a secure connection.   I need to obtain your verbal consent now.   Are you willing to proceed with your visit today?   Frank Schroeder has provided verbal consent on 07/10/2020 for a virtual visit (video or telephone).   Frank Gens, PA-C 07/10/2020  12:01 PM   Date:  07/10/2020   ID:  Frank Schroeder, DOB Aug 22, 1980, MRN [id number]  Patient Location: Home Provider Location: Home Office   Participants: Patient and Provider for Visit and Wrap up  Method of visit: Video  Location of Patient: Home Location of Provider: Home Office Consent was obtain for visit over the video. Services rendered by provider: Visit was performed via video  A video enabled telemedicine application was used and I verified that I am speaking with the correct person using two  identifiers.  PCP:  Frank Kern, DO   Chief Complaint:  Cough, congestion, fever  History of Present Illness:    Frank Schroeder is a 40 y.o. male with history as stated below. Presents video telehealth for an acute care visit  Onset of symptoms was within the last 12 hours and symptoms have been persistent and include: cough, mild congestion, fever Tmax 101.7*F, slight wheeze.  COVID positive at home test this morning.  Hx of mild asthma, has not needed to use his inhaler yet.      He had 3 doses of Moderna COVID vaccine.   Denies having shortness of breath, chest pain, n/v/d.   Modifying factors include: none tried.   No other aggravating or relieving factors.  No other c/o.   The patient does have symptoms concerning for COVID-19 infection (fever, chills, cough, or new shortness of breath).  Patient has been tested for COVID during this illness at home this morning, it was POSITIVE.  Past Medical, Surgical, Social History, Allergies, and Medications have been Reviewed.  Patient Active Problem List   Diagnosis Date Noted  . 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 Smoking status: Former Smoker    Quit date: 04/01/2006    Years since quitting: 14.2  . Smokeless tobacco: Former Network engineer Use Topics  .  Alcohol use: Yes    Comment: occ - 1 glass of wine     Current Outpatient Medications:  .  albuterol (VENTOLIN HFA) 108 (90 Base) MCG/ACT inhaler, Inhale 2 puffs into the lungs every 6 (six) hours as needed for wheezing or shortness of breath., Disp: 8 g, Rfl: 2 .  ondansetron (ZOFRAN) 4 MG tablet, Take 1 tablet (4 mg total) by mouth every 8 (eight) hours as needed for nausea or vomiting., Disp: 20 tablet, Rfl: 0 .  tobramycin-dexamethasone (TOBRADEX) ophthalmic solution, Place 2 drops into the right eye every 4 (four) hours while awake., Disp: 5 mL, Rfl: 0   No Known Allergies   ROS See HPI for history of present  illness.  Physical Exam Constitutional:      General: He is not in acute distress.    Appearance: Normal appearance. He is not ill-appearing, toxic-appearing or diaphoretic.     Comments: Resting comfortably in bed  HENT:     Head: Normocephalic.  Eyes:     Extraocular Movements: Extraocular movements intact.  Pulmonary:     Effort: Pulmonary effort is normal. No respiratory distress.     Breath sounds: No stridor.  Musculoskeletal:     Cervical back: Normal range of motion.  Neurological:     Mental Status: He is alert.  Psychiatric:        Mood and Affect: Mood normal.        Behavior: Behavior normal.               A&P  1. COVID  -Tylenol and motrin as needed for fever and pain  -Stay well hydrated, plenty of rest  -May use albuterol inhaler as needed  -Referral to covid treatment team placed, pt will be notified if he qualifies for outpatient treatment  -Discussed signs/symptoms that warrant in-person evaluation and treatment.   Patient voiced understanding and agreement to plan.   Time:   Today, I have spent 15 minutes with the patient with telehealth technology discussing the above problems, reviewing the chart, previous notes, medications and orders.    Tests Ordered: Orders Placed This Encounter  Procedures  . Ambulatory referral for Covid Treatment    Medication Changes: Meds ordered this encounter  Medications  . albuterol (VENTOLIN HFA) 108 (90 Base) MCG/ACT inhaler    Sig: Inhale 2 puffs into the lungs every 6 (six) hours as needed for wheezing or shortness of breath.    Dispense:  8 g    Refill:  2     Disposition:  Follow up with PCP as needed. Discussed signs/symptoms that warrant urgent/emergency evaluation and treatment in urgent care or emergency department.  Pt resource guide for COVID sent to pt.   Etta Grandchild, PA-C  07/10/2020 12:01 PM

## 2020-07-10 NOTE — Telephone Encounter (Signed)
Per COVID Questionnaire, reports symptoms below. Patient had video visit today. Routing to PCP.  This patient reported symptoms of new or worsening diarrhea, appetite, cough, or weakness in MyChart today.

## 2020-07-11 NOTE — Progress Notes (Signed)
Hi,  Thank you for taking care of him. As an FYI, I no longer do primary care for [name]. I did remove that designation from his chart.   [name]

## 2020-07-12 ENCOUNTER — Telehealth: Payer: Self-pay | Admitting: Adult Health

## 2020-07-12 NOTE — Telephone Encounter (Signed)
Called to discuss with patient about COVID-19 symptoms and the use of one of the available treatments for those with mild to moderate Covid symptoms and at a high risk of hospitalization.  Pt appears to qualify for outpatient treatment due to co-morbid conditions and/or a member of an at-risk group in accordance with the FDA Emergency Use Authorization.    Symptom onset: 4/10 Vaccinated: yes Immunocompromised? no Qualifiers: asthma  Unable to reach pt - [place]

## 2020-07-13 ENCOUNTER — Telehealth: Payer: Self-pay

## 2020-07-13 NOTE — Telephone Encounter (Signed)
Pt called back our hotline and left a message. attempted to call back today but no answer.    Angelena Form PA-C  [facility]

## 2020-07-13 NOTE — Telephone Encounter (Signed)
Pt.'s questionnaire triggered a BPA. Pt. Has new onset of diarrhea. Attempted to reach pt. And discuss new symptom. Left message. Please advise pt.

## 2020-09-27 ENCOUNTER — Other Ambulatory Visit: Payer: Self-pay | Admitting: Internal Medicine

## 2020-09-27 DIAGNOSIS — Z0000 Encounter for general adult medical examination without abnormal findings: Secondary | ICD-10-CM

## 2020-10-20 ENCOUNTER — Ambulatory Visit
Admission: RE | Admit: 2020-10-20 | Discharge: 2020-10-20 | Disposition: A | Discharge status: Home / Self Care | Payer: No Typology Code available for payment source | Source: Ambulatory Visit | Attending: Internal Medicine | Admitting: Internal Medicine

## 2020-10-20 DIAGNOSIS — Z0000 Encounter for general adult medical examination without abnormal findings: Secondary | ICD-10-CM

## 2021-02-17 ENCOUNTER — Telehealth: Payer: No Typology Code available for payment source | Admitting: Nurse Practitioner

## 2021-02-17 DIAGNOSIS — J208 Acute bronchitis due to other specified organisms: Secondary | ICD-10-CM

## 2021-02-17 MED ORDER — PREDNISONE 20 MG PO TABS: 20.0000 mg | ORAL_TABLET | Freq: Every day | ORAL | 0 refills | Status: AC

## 2021-02-17 MED ORDER — PSEUDOEPH-BROMPHEN-DM 30-2-10 MG/5ML PO SYRP: 5.0000 mL | ORAL_SOLUTION | Freq: Four times a day (QID) | ORAL | 0 refills | Status: AC | PRN

## 2021-02-17 MED ORDER — AZITHROMYCIN 250 MG PO TABS: ORAL_TABLET | ORAL | 0 refills | Status: AC

## 2021-02-17 NOTE — Progress Notes (Signed)
Virtual Visit Consent   Frank Schroeder, you are scheduled for a virtual visit with a [place] provider today.     Just as with appointments in the office, your consent must be obtained to participate.  Your consent will be active for this visit and any virtual visit you may have with one of our providers in the next 365 days.     If you have a MyChart account, a copy of this consent can be sent to you electronically.  All virtual visits are billed to your insurance company just like a traditional visit in the office.    As this is a virtual visit, video technology does not allow for your provider to perform a traditional examination.  This may limit your provider's ability to fully assess your condition.  If your provider identifies any concerns that need to be evaluated in person or the need to arrange testing (such as labs, EKG, etc.), we will make arrangements to do so.     Although advances in technology are sophisticated, we cannot ensure that it will always work on either your end or our end.  If the connection with a video visit is poor, the visit may have to be switched to a telephone visit.  With either a video or telephone visit, we are not always able to ensure that we have a secure connection.     I need to obtain your verbal consent now.   Are you willing to proceed with your visit today?    Elvyn Krohn Cleere has provided verbal consent on 02/17/2021 for a virtual visit (video or telephone).   Gildardo Pounds, NP   Date: 02/17/2021 11:31 AM   Virtual Visit via Video Note   I, Gildardo Pounds, connected with  ZAKIAH BECKERMAN  ([id number], [birth year]) on 02/17/21 at 11:15 AM EST by a video-enabled telemedicine application and verified that I am speaking with the correct person using two identifiers.  Location: Patient: Virtual Visit Location Patient: Home Provider: Virtual Visit Location Provider: Home Office   I discussed the limitations of evaluation and management by  telemedicine and the availability of in person appointments. The patient expressed understanding and agreed to proceed.    History of Present Illness: Frank Schroeder is a 40 y.o. who identifies as a male who was assigned male at birth, and is being seen today for Acute viral bronchitis.  HPI:  Onset of symptoms a few days ago. Currently experiencing fatigue, sore throat, nasal congestion, productive cough of thick yellow sputum Covid test negative. Has not tested for flu.  Cough and sore throat most bothersome symptoms. No relief with Nyquil. Has history of asthma.  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    Allergies: No Known Allergies Medications:  Current Outpatient Medications:    azithromycin (ZITHROMAX) 250 MG tablet, Take 2 tablets on day 1, then 1 tablet daily on days 2 through 5, Disp: 6 tablet, Rfl: 0   brompheniramine-pseudoephedrine-DM 30-2-10 MG/5ML syrup, Take 5 mLs by mouth 4 (four) times daily as needed., Disp: 240 mL, Rfl: 0   predniSONE (DELTASONE) 20 MG tablet, Take 1 tablet (20 mg total) by mouth daily with breakfast., Disp: 6 tablet, Rfl: 0   albuterol (VENTOLIN HFA) 108 (90 Base) MCG/ACT inhaler, Inhale 2 puffs into the lungs every 6 (six) hours as needed for wheezing or shortness of breath., Disp: 8 g, Rfl: 2  Observations/Objective: Patient is well-developed, well-nourished in no acute distress.  Resting comfortably at home.  Head is normocephalic, atraumatic.  No labored breathing.  Speech is clear and coherent with logical content.  Patient is alert and oriented at baseline.    Assessment and Plan: 1. Acute viral bronchitis - brompheniramine-pseudoephedrine-DM 30-2-10 MG/5ML syrup; Take 5 mLs by mouth 4 (four) times daily as needed.  Dispense: 240 mL; Refill: 0 - predniSONE (DELTASONE) 20 MG tablet; Take 1 tablet (20 mg total) by mouth daily with breakfast.  Dispense: 6 tablet;  Refill: 0 - azithromycin (ZITHROMAX) 250 MG tablet; Take 2 tablets on day 1, then 1 tablet daily on days 2 through 5  Dispense: 6 tablet; Refill: 0 INSTRUCTIONS: use a humidifier for nasal congestion Drink plenty of fluids, rest and wash hands frequently to avoid the spread of infection Alternate tylenol and Motrin for relief of fever   Follow Up Instructions: I discussed the assessment and treatment plan with the patient. The patient was provided an opportunity to ask questions and all were answered. The patient agreed with the plan and demonstrated an understanding of the instructions.  A copy of instructions were sent to the patient via MyChart unless otherwise noted below.   The patient was advised to call back or seek an in-person evaluation if the symptoms worsen or if the condition fails to improve as anticipated.  Time:  I spent 10 minutes with the patient via telehealth technology discussing the above problems/concerns.    Gildardo Pounds, NP

## 2021-02-17 NOTE — Patient Instructions (Signed)
  Angela Cox Gurney, thank you for joining Gildardo Pounds, NP for today's virtual visit.  While this provider is not your primary care provider (PCP), if your PCP is located in our provider database this encounter information will be shared with them immediately following your visit.  Consent: (Patient) Frank Schroeder provided verbal consent for this virtual visit at the beginning of the encounter.  Current Medications:  Current Outpatient Medications:    azithromycin (ZITHROMAX) 250 MG tablet, Take 2 tablets on day 1, then 1 tablet daily on days 2 through 5, Disp: 6 tablet, Rfl: 0   brompheniramine-pseudoephedrine-DM 30-2-10 MG/5ML syrup, Take 5 mLs by mouth 4 (four) times daily as needed., Disp: 240 mL, Rfl: 0   predniSONE (DELTASONE) 20 MG tablet, Take 1 tablet (20 mg total) by mouth daily with breakfast., Disp: 6 tablet, Rfl: 0   albuterol (VENTOLIN HFA) 108 (90 Base) MCG/ACT inhaler, Inhale 2 puffs into the lungs every 6 (six) hours as needed for wheezing or shortness of breath., Disp: 8 g, Rfl: 2   Medications ordered in this encounter:  Meds ordered this encounter  Medications   brompheniramine-pseudoephedrine-DM 30-2-10 MG/5ML syrup    Sig: Take 5 mLs by mouth 4 (four) times daily as needed.    Dispense:  240 mL    Refill:  0    Order Specific Question:   Supervising Provider    Answer:   MILLER, Jaycen [[id number]]   predniSONE (DELTASONE) 20 MG tablet    Sig: Take 1 tablet (20 mg total) by mouth daily with breakfast.    Dispense:  6 tablet    Refill:  0    Order Specific Question:   Supervising Provider    Answer:   MILLER, Alvino [[id number]]   azithromycin (ZITHROMAX) 250 MG tablet    Sig: Take 2 tablets on day 1, then 1 tablet daily on days 2 through 5    Dispense:  6 tablet    Refill:  0    Order Specific Question:   Supervising Provider    Answer:   Noemi Chapel [[id number]]     *If you need refills on other medications prior to your next appointment, please contact your  pharmacy*  Follow-Up: Call back or seek an in-person evaluation if the symptoms worsen or if the condition fails to improve as anticipated.  Other Instructions INSTRUCTIONS: use a humidifier for nasal congestion Drink plenty of fluids, rest and wash hands frequently to avoid the spread of infection Alternate tylenol and Motrin for relief of fever    If you have been instructed to have an in-person evaluation today at a local Urgent Care facility, please use the link below. It will take you to a list of all of our available [facility], including address, phone number and hours of operation. Please do not delay care.  [facility]  If you or a family member do not have a primary care provider, use the link below to schedule a visit and establish care. When you choose a [facility] primary care physician or advanced practice provider, you gain a long-term partner in health. Find a Primary Care Provider  Learn more about [facility]'s in-office and virtual care options: [facility]

## 2021-12-14 NOTE — Progress Notes (Deleted)
   I, Peterson Lombard, LAT, ATC acting as a scribe for Lynne Leader, MD.  Subjective:    CC: R shoulder pain   HPI: Pt is a 41 y/o male c/o R shoulder pain x /. Pt locates pain to   Radiates:  UE Numbness/tingling: UE Weakness: Aggravates: Treatments tried:  Pertinent review of Systems: ***  Relevant historical information: ***   Objective:   There were no vitals filed for this visit. General: Well Developed, well nourished, and in no acute distress.   MSK: ***  Lab and Radiology Results No results found for this or any previous visit (from the past 72 hour(s)). No results found.    Impression and Recommendations:    Assessment and Plan: 41 y.o. male with ***.  PDMP not reviewed this encounter. No orders of the defined types were placed in this encounter.  No orders of the defined types were placed in this encounter.   Discussed warning signs or symptoms. Please see discharge instructions. Patient expresses understanding.   ***

## 2021-12-17 ENCOUNTER — Ambulatory Visit: Payer: No Typology Code available for payment source | Admitting: Family Medicine

## 2021-12-21 ENCOUNTER — Encounter (INDEPENDENT_AMBULATORY_CARE_PROVIDER_SITE_OTHER): Payer: Self-pay
